# Patient Record
Sex: Female | Born: 1940 | Race: White | Hispanic: No | Smoking: Never smoker
Health system: Southern US, Community
[De-identification: ages and names within clinical notes are randomized; demographics above are authoritative.]

## PROBLEM LIST (undated history)

## (undated) DIAGNOSIS — H269 Unspecified cataract: Secondary | ICD-10-CM

## (undated) DIAGNOSIS — C801 Malignant (primary) neoplasm, unspecified: Secondary | ICD-10-CM

## (undated) DIAGNOSIS — I10 Essential (primary) hypertension: Secondary | ICD-10-CM

## (undated) DIAGNOSIS — M179 Osteoarthritis of knee, unspecified: Secondary | ICD-10-CM

## (undated) DIAGNOSIS — E785 Hyperlipidemia, unspecified: Secondary | ICD-10-CM

## (undated) DIAGNOSIS — R609 Edema, unspecified: Secondary | ICD-10-CM

## (undated) DIAGNOSIS — K219 Gastro-esophageal reflux disease without esophagitis: Secondary | ICD-10-CM

## (undated) DIAGNOSIS — M1710 Unilateral primary osteoarthritis, unspecified knee: Secondary | ICD-10-CM

## (undated) HISTORY — DX: Unspecified cataract: H26.9

## (undated) HISTORY — PX: OTHER SURGICAL HISTORY: SHX169

## (undated) HISTORY — PX: HERNIA REPAIR: SHX51

## (undated) HISTORY — DX: Edema, unspecified: R60.9

## (undated) HISTORY — PX: NECK DISSECTION: SUR422

## (undated) HISTORY — DX: Hyperlipidemia, unspecified: E78.5

## (undated) HISTORY — DX: Essential (primary) hypertension: I10

## (undated) HISTORY — DX: Malignant (primary) neoplasm, unspecified: C80.1

## (undated) HISTORY — DX: Gastro-esophageal reflux disease without esophagitis: K21.9

## (undated) HISTORY — DX: Unilateral primary osteoarthritis, unspecified knee: M17.10

## (undated) HISTORY — DX: Osteoarthritis of knee, unspecified: M17.9

---

## 1996-02-24 DIAGNOSIS — C801 Malignant (primary) neoplasm, unspecified: Secondary | ICD-10-CM

## 1996-02-24 HISTORY — DX: Malignant (primary) neoplasm, unspecified: C80.1

## 2003-02-22 ENCOUNTER — Encounter: Payer: Self-pay | Admitting: Family Medicine

## 2006-08-10 ENCOUNTER — Encounter: Payer: Self-pay | Admitting: Family Medicine

## 2006-08-10 LAB — CONVERTED CEMR LAB
ALT: 20 units/L
AST: 23 units/L
Alkaline Phosphatase: 73 units/L
Chloride: 105 meq/L
Total Protein: 6.4 g/dL

## 2007-02-03 ENCOUNTER — Ambulatory Visit: Payer: Self-pay | Admitting: Family Medicine

## 2007-02-03 DIAGNOSIS — R609 Edema, unspecified: Secondary | ICD-10-CM | POA: Insufficient documentation

## 2007-02-03 DIAGNOSIS — M25561 Pain in right knee: Secondary | ICD-10-CM

## 2007-02-03 DIAGNOSIS — M25562 Pain in left knee: Secondary | ICD-10-CM

## 2007-02-03 DIAGNOSIS — I10 Essential (primary) hypertension: Secondary | ICD-10-CM | POA: Insufficient documentation

## 2007-02-08 ENCOUNTER — Encounter: Payer: Self-pay | Admitting: Family Medicine

## 2007-02-09 ENCOUNTER — Encounter: Payer: Self-pay | Admitting: Family Medicine

## 2007-02-09 LAB — CONVERTED CEMR LAB
ALT: 16 units/L (ref 0–35)
Albumin: 4 g/dL (ref 3.5–5.2)
Alkaline Phosphatase: 82 units/L (ref 39–117)
Chloride: 104 meq/L (ref 96–112)
Creatinine, Ser: 0.79 mg/dL (ref 0.40–1.20)
Glucose, Bld: 100 mg/dL — ABNORMAL HIGH (ref 70–99)
Total Protein: 6.8 g/dL (ref 6.0–8.3)
Triglycerides: 78 mg/dL (ref <150)
VLDL: 16 mg/dL (ref 0–40)

## 2007-02-10 ENCOUNTER — Encounter: Payer: Self-pay | Admitting: Family Medicine

## 2007-02-21 ENCOUNTER — Encounter: Payer: Self-pay | Admitting: Family Medicine

## 2007-04-07 ENCOUNTER — Ambulatory Visit: Payer: Self-pay | Admitting: Family Medicine

## 2007-08-04 ENCOUNTER — Other Ambulatory Visit: Admission: RE | Admit: 2007-08-04 | Discharge: 2007-08-04 | Payer: Self-pay | Admitting: Family Medicine

## 2007-08-04 ENCOUNTER — Ambulatory Visit: Payer: Self-pay | Admitting: Family Medicine

## 2007-08-04 ENCOUNTER — Encounter: Payer: Self-pay | Admitting: Family Medicine

## 2007-08-09 LAB — CONVERTED CEMR LAB: Pap Smear: NORMAL

## 2007-08-24 ENCOUNTER — Ambulatory Visit: Payer: Self-pay | Admitting: Family Medicine

## 2007-08-24 LAB — CONVERTED CEMR LAB
OCCULT 1: NEGATIVE
OCCULT 2: NEGATIVE
OCCULT 3: NEGATIVE

## 2008-08-30 ENCOUNTER — Ambulatory Visit: Payer: Self-pay | Admitting: Family Medicine

## 2008-08-30 DIAGNOSIS — Z780 Asymptomatic menopausal state: Secondary | ICD-10-CM | POA: Insufficient documentation

## 2008-09-03 ENCOUNTER — Telehealth (INDEPENDENT_AMBULATORY_CARE_PROVIDER_SITE_OTHER): Payer: Self-pay | Admitting: *Deleted

## 2008-09-10 ENCOUNTER — Encounter: Admission: RE | Admit: 2008-09-10 | Discharge: 2008-09-10 | Payer: Self-pay | Admitting: Family Medicine

## 2008-09-10 ENCOUNTER — Encounter: Payer: Self-pay | Admitting: Family Medicine

## 2008-09-10 DIAGNOSIS — M899 Disorder of bone, unspecified: Secondary | ICD-10-CM

## 2008-09-10 DIAGNOSIS — M949 Disorder of cartilage, unspecified: Secondary | ICD-10-CM

## 2008-09-10 DIAGNOSIS — M8580 Other specified disorders of bone density and structure, unspecified site: Secondary | ICD-10-CM | POA: Insufficient documentation

## 2008-09-11 DIAGNOSIS — R928 Other abnormal and inconclusive findings on diagnostic imaging of breast: Secondary | ICD-10-CM | POA: Insufficient documentation

## 2008-09-11 DIAGNOSIS — E785 Hyperlipidemia, unspecified: Secondary | ICD-10-CM | POA: Insufficient documentation

## 2008-09-11 LAB — CONVERTED CEMR LAB
AST: 20 units/L (ref 0–37)
Albumin: 4.4 g/dL (ref 3.5–5.2)
BUN: 22 mg/dL (ref 6–23)
CO2: 30 meq/L (ref 19–32)
Calcium: 9.5 mg/dL (ref 8.4–10.5)
Chloride: 101 meq/L (ref 96–112)
Cholesterol: 215 mg/dL — ABNORMAL HIGH (ref 0–200)
Creatinine, Ser: 0.91 mg/dL (ref 0.40–1.20)
Glucose, Bld: 102 mg/dL — ABNORMAL HIGH (ref 70–99)
LDL Cholesterol: 139 mg/dL — ABNORMAL HIGH (ref 0–99)
Potassium: 4 meq/L (ref 3.5–5.3)
Sodium: 142 meq/L (ref 135–145)
Total Protein: 7 g/dL (ref 6.0–8.3)
VLDL: 17 mg/dL (ref 0–40)

## 2008-10-01 ENCOUNTER — Encounter: Admission: RE | Admit: 2008-10-01 | Discharge: 2008-10-01 | Payer: Self-pay | Admitting: Family Medicine

## 2008-12-25 ENCOUNTER — Telehealth (INDEPENDENT_AMBULATORY_CARE_PROVIDER_SITE_OTHER): Payer: Self-pay | Admitting: *Deleted

## 2009-08-02 ENCOUNTER — Ambulatory Visit: Payer: Self-pay | Admitting: Family Medicine

## 2009-08-02 DIAGNOSIS — M47816 Spondylosis without myelopathy or radiculopathy, lumbar region: Secondary | ICD-10-CM | POA: Insufficient documentation

## 2009-08-02 DIAGNOSIS — R209 Unspecified disturbances of skin sensation: Secondary | ICD-10-CM

## 2009-08-21 ENCOUNTER — Encounter: Payer: Self-pay | Admitting: Family Medicine

## 2009-08-21 LAB — CONVERTED CEMR LAB
ALT: 15 units/L (ref 0–35)
Albumin: 4.2 g/dL (ref 3.5–5.2)
CO2: 30 meq/L (ref 19–32)
Cholesterol: 195 mg/dL (ref 0–200)
Glucose, Bld: 105 mg/dL — ABNORMAL HIGH (ref 70–99)
LDL Cholesterol: 110 mg/dL — ABNORMAL HIGH (ref 0–99)
Potassium: 3.8 meq/L (ref 3.5–5.3)
Sodium: 140 meq/L (ref 135–145)
TSH: 2.746 microintl units/mL (ref 0.350–4.500)
Total CHOL/HDL Ratio: 3
Triglycerides: 99 mg/dL (ref <150)
Vitamin B-12: 345 pg/mL (ref 211–911)

## 2009-08-27 ENCOUNTER — Telehealth: Payer: Self-pay | Admitting: Family Medicine

## 2009-09-30 ENCOUNTER — Ambulatory Visit: Payer: Self-pay | Admitting: Family Medicine

## 2009-09-30 DIAGNOSIS — L03119 Cellulitis of unspecified part of limb: Secondary | ICD-10-CM

## 2009-09-30 DIAGNOSIS — L02419 Cutaneous abscess of limb, unspecified: Secondary | ICD-10-CM

## 2009-10-01 ENCOUNTER — Encounter: Payer: Self-pay | Admitting: Family Medicine

## 2009-10-01 ENCOUNTER — Telehealth (INDEPENDENT_AMBULATORY_CARE_PROVIDER_SITE_OTHER): Payer: Self-pay | Admitting: *Deleted

## 2009-10-01 LAB — CONVERTED CEMR LAB
Basophils Absolute: 0 10*3/uL (ref 0.0–0.1)
Eosinophils Relative: 3 % (ref 0–5)
HCT: 37.8 % (ref 36.0–46.0)
MCHC: 32.3 g/dL (ref 30.0–36.0)
MCV: 97.4 fL (ref 78.0–100.0)
Monocytes Absolute: 0.7 10*3/uL (ref 0.1–1.0)
Monocytes Relative: 10 % (ref 3–12)
Neutro Abs: 4.8 10*3/uL (ref 1.7–7.7)
WBC: 6.8 10*3/uL (ref 4.0–10.5)

## 2009-10-03 ENCOUNTER — Encounter: Payer: Self-pay | Admitting: Family Medicine

## 2009-10-17 ENCOUNTER — Encounter: Payer: Self-pay | Admitting: Family Medicine

## 2009-10-18 ENCOUNTER — Inpatient Hospital Stay (HOSPITAL_COMMUNITY): Admission: RE | Admit: 2009-10-18 | Discharge: 2009-10-21 | Payer: Self-pay | Admitting: Orthopedic Surgery

## 2010-03-12 ENCOUNTER — Telehealth (INDEPENDENT_AMBULATORY_CARE_PROVIDER_SITE_OTHER): Payer: Self-pay | Admitting: *Deleted

## 2010-03-25 NOTE — Letter (Signed)
Summary: [facility]   Imported By: Lanelle Bal 10/30/2009 09:38:32  _____________________________________________________________________  External Attachment:    Type:   Image     Comment:   External Document

## 2010-03-25 NOTE — Assessment & Plan Note (Signed)
Summary: HTN/ facial numbness/ LBP   Vital Signs:  Patient profile:   70 year old female Height:      63.5 inches Weight:      160 pounds BMI:     28.00 O2 Sat:      96 % on Room air Pulse rate:   88 / minute BP sitting:   126 / 79  (left arm) Cuff size:   large  Vitals Entered By: Payton Spark CMA (August 02, 2009 10:09 AM)  O2 Flow:  Room air CC: F/U HTN. Also c/o back pain.   Primary Care Provider:  Seymour Bars DO  CC:  F/U HTN. Also c/o back pain.Katherine Cohen  History of Present Illness: 70 yo WF presens for f/u HTN and about 1 month of LBP.  She is a Runner, broadcasting/film/video and after prolonged standing, her Lower back hurts.  It is also worse iwth doing housework on the weekends.  Denies hx of back problems or trauma.  She is on Diclofenac for arthritis which helps along with aspercreme and rest.  Denies radiation down the buttock or legs.  No tingling or weakness in her legs.   She has tingling over the L side of her face.  This started about 6 mos ago.  Denies any change in vision, trouble swallowing or with facial movements.  She has not been the the dentist in years.  Denies jaw pain, dental pain or ear pain.  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  She has not followed up with oncology, ENT for years after her surgery.  Denies HAs.    Current Medications (verified): 1)  Prilosec Otc 20 Mg  Tbec (Omeprazole Magnesium) .... Take 1 Tablet By Mouth Once A Day 2)  Diclofenac Sodium 75 Mg  Tbec (Diclofenac Sodium) .... Take 1 Tablet By Mouth Two Times A Day 3)  Enalapril-Hydrochlorothiazide 10-25 Mg Tabs (Enalapril-Hydrochlorothiazide) .Katherine Cohen.. 1 Tab By Mouth Daily 4)  Aspirin 81 Mg  Tbec (Aspirin) .... One By Mouth Every Day 5)  Calcium 1500 Mg Tabs (Calcium Carbonate) .Katherine Cohen.. 1 By Mouth Once Daily 6)  Graduated Compression Panty Hose .... Dx: Bilateral Venous Stasis, Leg Edema 7)  Pravastatin Sodium 20 Mg Tabs (Pravastatin Sodium) .Katherine Cohen.. 1 Tab By  Mouth Qhs  Allergies (verified): No Known Drug Allergies  Past History:  Past Medical History: Reviewed history from 08/30/2008 and no changes required. 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 neck dissection, L  Family History: Reviewed history from 02/03/2007 and no changes required. mother died of stroke at 74. father died of AMI at 97 brother died of lung cancer brother died of MVA 5 sibblings alive  Social History: Reviewed history from 02/03/2007 and no changes required. Barrister's clerk at [employer].   Married to [place].  Has 2 grown sons in [place] and [place]. 1 granddaughter. Never smoked. 2-3 glasses of wine/ day No exercise. Fair diet.  Review of Systems      See HPI  Physical Exam  General:  alert, well-developed, well-nourished, and well-hydrated.   Head:  normocephalic and atraumatic.   Eyes:  PERRLA Nose:  no nasal discharge.   Mouth:    o/p pink and slightly dry.good dentition and poor dentition.   Neck:  L neck w/ surgical scars and one hard nodule in the submandibular region, slightly fixed. Lungs:  Normal  respiratory effort, chest expands symmetrically. Lungs are clear to auscultation, no crackles or wheezes.no crackles.   Heart:  normal rate, regular rhythm, and no murmur.   Msk:  lumbar scoliosis with  Skin:  color normal.   Cervical Nodes:  No lymphadenopathy noted Psych:  good eye contact, not anxious appearing, and not depressed appearing.     Impression & Recommendations:  Problem # 1:  LUMBAGO (ICD-724.2) L Lower MSK back pain with scoliosis and lack of exercise.  Seeing improvement with walking, sleeping, NSAIDs and aspercreme.  Will set her up for PT to eval and treat and show her how to stretch and exercise on her own. Her updated medication list for this problem includes:    Diclofenac Sodium 75 Mg Tbec (Diclofenac sodium) .Katherine Cohen...  Take 1 tablet by mouth two times a day    Aspirin 81 Mg Tbec (Aspirin) ..... One by mouth every day  Orders: Physical Therapy Referral (PT)  Her updated medication list for this problem includes:    Diclofenac Sodium 75 Mg Tbec (Diclofenac sodium) .Katherine Cohen... Take 1 tablet by mouth two times a day    Aspirin 81 Mg Tbec (Aspirin) ..... 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  Normal neuro exam and motor function.  Will get a CT face/ neck to be sure no sign of cancer is present. Orders: T-Vitamin B12 ([id number]) T-CT Maxillofacial w/o cm (09811) T-CT Neck w/o cm (91478)  Problem # 3:  ESSENTIAL HYPERTENSION, BENIGN (ICD-401.1) BP at goal.  Continue current meds.  Update fasting labs.  Congrats on 15# lost.   Her updated medication list for this problem includes:    Enalapril-hydrochlorothiazide 10-25 Mg Tabs (Enalapril-hydrochlorothiazide) .Katherine Cohen... 1 tab by mouth daily  BP today: 126/79 Prior BP: 159/85 (08/30/2008)  Labs Reviewed: K+: 4.0 (09/10/2008) Creat: : 0.91 (09/10/2008)   Chol: 215 (09/10/2008)   HDL: 59 (09/10/2008)   LDL: 139 (09/10/2008)   TG: 84 (09/10/2008)  Complete Medication List: 1)  Prilosec Otc 20 Mg Tbec (Omeprazole magnesium) .... Take 1 tablet by mouth once a day 2)  Diclofenac Sodium 75 Mg Tbec (Diclofenac sodium) .... Take 1 tablet by mouth two times a day 3)  Enalapril-hydrochlorothiazide 10-25 Mg Tabs (Enalapril-hydrochlorothiazide) .Katherine Cohen.. 1 tab by mouth daily 4)  Aspirin 81 Mg Tbec (Aspirin) .... One by mouth every day 5)  Calcium 1500 Mg Tabs (Calcium carbonate) .Katherine Cohen.. 1 by mouth once daily 6)  Graduated Compression Panty Hose  .... Dx: bilateral venous stasis, leg edema 7)  Pravastatin Sodium 20 Mg Tabs (Pravastatin sodium) .Katherine Cohen.. 1 tab by mouth qhs  Other Orders: T-Comprehensive Metabolic  Panel ([id number]) T-Lipid Profile [id number]) T-TSH ([phone]) T-Vitamin D (25-Hydroxy) ([phone])  Patient Instructions: 1)  BP looks great! 2)  Stay on current meds. 3)  Update fasting labs downstairs. 4)  Will call you w/ the results. 5)  Will plan to do your CT face/ neck in the next 2 wks. 6)  Return for f/u in 4 mos.

## 2010-03-25 NOTE — Progress Notes (Signed)
Summary: refill enalapril  Phone Note Refill Request Message from:  Fax from Pharmacy on August 27, 2009 3:01 PM  Refills Requested: Medication #1:  ENALAPRIL MALEATE 20 MG TABS 1 tab by mouth daily [facility] [phone]   Method Requested: Fax to Local Pharmacy Initial call taken by: Duard Brady LPN,  August 27, 1912 3:01 PM    Prescriptions: ENALAPRIL MALEATE 20 MG TABS (ENALAPRIL MALEATE) 1 tab by mouth daily  #30 x 3   Entered by:   Duard Brady LPN   Authorized by:   Seymour Bars DO   Signed by:   Duard Brady LPN on 78/29/5621   Method used:   [facility]ed to ...       [facility]       [address]       [place], [place]  [address]       Ph: [phone]       Fax: [phone]   RxID:   [id number]

## 2010-03-25 NOTE — Progress Notes (Signed)
Summary: DVT study : NEG  Phone Note Outgoing Call   Summary of Call: Pls let pt know that her RLE venous doppler is negative for DVT.   Initial call taken by: Seymour Bars DO,  October 01, 2009 4:21 PM  Follow-up for Phone Call        Pt aware of the above Follow-up by: Payton Spark CMA,  October 01, 2009 4:26 PM

## 2010-03-25 NOTE — Assessment & Plan Note (Signed)
Summary: traumatic purpura   Vital Signs:  Patient profile:   70 year old female Height:      63.5 inches Weight:      164 pounds BMI:     28.70 O2 Sat:      97 % on Room air Temp:     98.1 degrees F oral Pulse rate:   67 / minute BP sitting:   149 / 77  (left arm) Cuff size:   regular  Vitals Entered By: Payton Spark CMA (September 30, 2009 1:44 PM)  O2 Flow:  Room air CC: R lower leg pain and swelling x 1.5 weeks. Wooden crate filled w/ corn fell on lower R leg.   Primary Care Provider:  Seymour Bars DO  CC:  R lower leg pain and swelling x 1.5 weeks. Wooden crate filled w/ corn fell on lower R leg.Marland Kitchen  History of Present Illness: 70 yo WF presents for RLE pain and swelling x 10 days ago.  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  She reports that it was rather heavy and she has had pain ever since.  She is only taking OTC pain meds.  She is able to bear weight and ambulate with minimal pain.  She has had redness and blistering with severe swelling over with distal RLE.  She has been elevating it and taking old Lasix.  Denies fevers, chills or bleeding.  Current Medications (verified): 1)  Prilosec Otc 20 Mg  Tbec (Omeprazole Magnesium) .... Take 1 Tablet By Mouth Once A Day 2)  Diclofenac Sodium 75 Mg  Tbec (Diclofenac Sodium) .... Take 1 Tablet By Mouth Two Times A Day 3)  Enalapril Maleate 20 Mg Tabs (Enalapril Maleate) .Marland Kitchen.. 1 Tab By Mouth Daily 4)  Aspirin 81 Mg  Tbec (Aspirin) .... One By Mouth Every Day 5)  Calcium 1500 Mg Tabs (Calcium Carbonate) .Marland Kitchen.. 1 By Mouth Once Daily 6)  Pravastatin Sodium 20 Mg Tabs (Pravastatin Sodium) .Marland Kitchen.. 1 Tab By Mouth Qhs  Allergies (verified): No Known Drug Allergies  Past History:  Past Medical History: Reviewed history from 08/30/2008 and no changes required. 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 Museum/gallery conservator at [employer]   Married to [place].  Has 2 grown sons in [place] and [place]. 1 granddaughter. Never smoked. 2-3 glasses of wine/ day No exercise. Fair diet.  Review of Systems      See HPI  Physical Exam  General:  alert, well-developed, well-nourished, and well-hydrated.   Head:  normocephalic and atraumatic.   Lungs:  Normal respiratory effort, chest expands symmetrically. Lungs are clear to auscultation, no crackles or wheezes.no crackles.   Heart:  normal rate, regular rhythm, and no murmur.   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   Skin:  RLE purpura with moderate sized bullae, tender with surrounding erythema Psych:  good eye contact, not anxious appearing, and not depressed appearing.     Impression & Recommendations:  Problem # 1:  CELLULITIS AND ABSCESS OF LEG EXCEPT FOOT (ICD-682.6) With bullae and purpura, secondary to trauma. Treat with Bactrim DS x 10 days and Furosemide 40 mg/ day for swelling x 10  days. Elevate legs and carefully clean with soap and water.  Will send to Dr Lajoyce Corners for wound care -- may need [facility]. Labs today - CBC and D Dimer given unilateral acute swelling.  Her updated medication list for this problem includes:    Sulfamethoxazole-tmp Ds 800-160 Mg Tabs (Sulfamethoxazole-trimethoprim) .Marland Kitchen... 1 tab by mouth two times a day x 10 days  Orders: Wound Care Center Referral (Wound Care) T-CBC w/Diff [phone])  Complete Medication List: 1)  Prilosec Otc 20 Mg Tbec (Omeprazole magnesium) .... Take 1 tablet by mouth once a day 2)  Diclofenac Sodium 75 Mg Tbec (Diclofenac sodium) .... Take 1 tablet by mouth two times a day 3)  Enalapril Maleate 20 Mg Tabs (Enalapril maleate) .Marland Kitchen.. 1 tab by mouth daily 4)  Aspirin 81 Mg Tbec (Aspirin) .... One by mouth every day 5)   Calcium 1500 Mg Tabs (Calcium carbonate) .Marland Kitchen.. 1 by mouth once daily 6)  Pravastatin Sodium 20 Mg Tabs (Pravastatin sodium) .Marland Kitchen.. 1 tab by mouth qhs 7)  Furosemide 40 Mg Tabs (Furosemide) .Marland Kitchen.. 1 tab by mouth daily x 10 days 8)  Sulfamethoxazole-tmp Ds 800-160 Mg Tabs (Sulfamethoxazole-trimethoprim) .Marland Kitchen.. 1 tab by mouth two times a day x 10 days  Other Orders: T-D-Dimer Fibrin Derivatives Quantitive [id number])  Patient Instructions: 1)  Lab today. 2)  Will call you w/ results tomorrow. 3)  Start on generic Bactrim DS today (antibiotic) and take for 10 days (with food). 4)  Take Furosemide 40 mg daily as your water pill. 5)  Eat a banana each day to supplement your potassium while on this. 6)  Gently clean R leg with soap and water each day. 7)  Elevate leg and stay off your feet until you are seen by wound care downstairs.   Prescriptions: SULFAMETHOXAZOLE-TMP DS 800-160 MG TABS (SULFAMETHOXAZOLE-TRIMETHOPRIM) 1 tab by mouth two times a day x 10 days  #20 x 0   Entered and Authorized by:   Seymour Bars DO   Signed by:   Seymour Bars DO on 09/30/2009   Method used:   [facility] to        [facility] [phone]* (retail)       [address] [place], [place]  [address]       Ph: [phone] or [phone]       Fax: [phone]   RxID:   [id number] FUROSEMIDE 40 MG TABS (FUROSEMIDE) 1 tab by mouth daily x 10 days  #10 x 0   Entered and Authorized by:   Seymour Bars DO   Signed by:   Seymour Bars DO on 09/30/2009   Method used:   [facility] to        [facility] [phone]* (retail)       [address] [place], [place]  [address]       Ph: [phone] or [phone]       Fax: [phone]   RxID:   [id number]

## 2010-03-25 NOTE — Consult Note (Signed)
Summary: [facility]   Imported By: Lanelle Bal 10/18/2009 13:05:52  _____________________________________________________________________  External Attachment:    Type:   Image     Comment:   External Document

## 2010-03-27 NOTE — Progress Notes (Signed)
Summary: Need an appt?  Phone Note Refill Request Call back at Work Phone [phone] Message from:  Patient on March 12, 2010 1:23 PM  Refills Requested: Medication #1:  PRAVASTATIN SODIUM 20 MG TABS 1 tab by mouth qhs   Dosage confirmed as above?Dosage Confirmed   Brand Name Necessary? No   Supply Requested: 3 months  Medication #2:  DICLOFENAC SODIUM 75 MG  TBEC Take 1 tablet by mouth two times a day   Dosage confirmed as above?Dosage Confirmed   Brand Name Necessary? No   Supply Requested: 3 months  Medication #3:  ENALAPRIL MALEATE 20 MG TABS 1 tab by mouth daily   Dosage confirmed as above?Dosage Confirmed   Brand Name Necessary? 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       [facility]       [address]       [place], [place]  [address]       Ph: [phone]       Fax: [phone]   RxID:   [id number] DICLOFENAC SODIUM 75 MG  TBEC (DICLOFENAC SODIUM) Take 1 tablet by mouth two times a day  #180 x 0   Entered by:   Payton Spark CMA   Authorized by:   Seymour Bars DO   Signed by:   Payton Spark CMA on 03/12/2010   Method used:   Faxed to ...       [facility]       [address]       [place], [place]  [address]       Ph: [phone]       Fax: [phone]   RxID:   [id number] ENALAPRIL MALEATE 20 MG TABS  (ENALAPRIL MALEATE) 1 tab by mouth daily  #90 x 0   Entered by:   Payton Spark CMA   Authorized by:   Seymour Bars DO   Signed by:   Payton Spark CMA on 03/12/2010   Method used:   Faxed to ...       [facility]       [address]       [place], [place]  [address]       Ph: [phone]       Fax: [phone]   RxID:   [id number]

## 2010-05-09 LAB — COMPREHENSIVE METABOLIC PANEL
Albumin: 4.3 g/dL (ref 3.5–5.2)
BUN: 18 mg/dL (ref 6–23)
CO2: 31 mEq/L (ref 19–32)
Calcium: 9.7 mg/dL (ref 8.4–10.5)
Chloride: 102 mEq/L (ref 96–112)
Creatinine, Ser: 0.79 mg/dL (ref 0.4–1.2)
GFR calc Af Amer: >60 mL/min (ref >60)
GFR calc non Af Amer: >60 mL/min (ref >60)
Sodium: 139 mEq/L (ref 135–145)
Total Bilirubin: 0.6 mg/dL (ref 0.3–1.2)
Total Protein: 7 g/dL (ref 6.0–8.3)

## 2010-05-09 LAB — PROTIME-INR
INR: 0.9 (ref 0.00–1.49)
Prothrombin Time: 12.4 seconds (ref 11.6–15.2)

## 2010-05-09 LAB — SURGICAL PCR SCREEN: MRSA, PCR: NEGATIVE

## 2010-05-09 LAB — CBC: RDW: 13.3 % (ref 11.5–15.5)

## 2010-07-05 ENCOUNTER — Encounter: Payer: Self-pay | Admitting: Family Medicine

## 2010-07-07 ENCOUNTER — Ambulatory Visit (INDEPENDENT_AMBULATORY_CARE_PROVIDER_SITE_OTHER): Payer: Self-pay | Admitting: Family Medicine

## 2010-07-07 ENCOUNTER — Encounter: Payer: Self-pay | Admitting: Family Medicine

## 2010-07-07 VITALS — BP 157/87 | HR 71 | Ht 63.5 in | Wt 161.0 lb

## 2010-07-07 DIAGNOSIS — Z0000 Encounter for general adult medical examination without abnormal findings: Secondary | ICD-10-CM

## 2010-07-07 DIAGNOSIS — K5900 Constipation, unspecified: Secondary | ICD-10-CM

## 2010-07-07 DIAGNOSIS — I10 Essential (primary) hypertension: Secondary | ICD-10-CM

## 2010-07-07 DIAGNOSIS — Z1239 Encounter for other screening for malignant neoplasm of breast: Secondary | ICD-10-CM

## 2010-07-07 DIAGNOSIS — M8580 Other specified disorders of bone density and structure, unspecified site: Secondary | ICD-10-CM

## 2010-07-07 MED ORDER — DICLOFENAC SODIUM 75 MG PO TBEC: 75.0000 mg | DELAYED_RELEASE_TABLET | Freq: Two times a day (BID) | ORAL | Status: DC

## 2010-07-07 MED ORDER — CHLORTHALIDONE 25 MG PO TABS: 25.0000 mg | ORAL_TABLET | Freq: Every day | ORAL | Status: DC

## 2010-07-07 MED ORDER — ENALAPRIL MALEATE 20 MG PO TABS: 20.0000 mg | ORAL_TABLET | Freq: Every day | ORAL | Status: DC

## 2010-07-07 MED ORDER — PRAVASTATIN SODIUM 20 MG PO TABS: 20.0000 mg | ORAL_TABLET | Freq: Every day | ORAL | Status: DC

## 2010-07-07 NOTE — Progress Notes (Signed)
  Subjective:    Patient ID: Katherine Cohen, female    DOB: September 29, 1940, 70 y.o.   MRN: [id number]  HPI 70 yo WF presents for CPE w/o pap smear.  She has HTN, Hyperlpidemia, osteopenia and had a traumatic ulcer on her  Leg that required surgical debridement with Dr Lajoyce Corners last year.  It is healing nicely.  She is due for RF of her meds.  Had labs in June of 2011.  Denies CP or SOB.  She is overdue for mammogram after having an abnormal in 2010 with failure to return.  Her DEXA is due.  She is married.  Diclofenac does help her arthritis pain.  BP 157/87  Pulse 71  Ht 5' 3.5" (1.613 m)  Wt 161 lb (73.029 kg)  BMI 28.07 kg/m2  SpO2 98%   Review of Systems Gen: no fevers, chills, hot flashes, night sweats, change in weight GI: no N/V/ D.  New onset constipation, no blood in stools, RLQ pain. GU: no dysuria, incontinence or sexual dysfunction CV: no chest pain, DOE, palpitations s or edema Pulm:  Denies CP, SOB or chronic cough      Objective:   Physical Exam    Gen: alert, well groomed in NAD, overwt Neck: no thyromegaly or cervical lymphadenopathy, s/p surgical neck dissection with scarring on the L. CV: RRR w/o murmur, no audible carotid bruits or abdominal aortic bruits Ext: 2+ nonpitting edema with RLE distal scar from previous ulcer with hyperemia, no clubbing or cyanosis Lungs: CTA bilat w/o W/R/R; nonlabored HEENT:  [place]/AT; PERRLA; oropharynx pink and moist with good dentition Abd: soft, NT, ND, NABS, No HSM, no audible AA bruits Skin: warm and dry; no rash, pallor or jaundice Psych: does not appear anxious or depressed; answers questions appropriately Breasts: no palpable masses, dimpling, axillary LA, nipple discharge    Assessment & Plan:  Assesment:  1. CPE- Keeping healthy checklist for women reviewed today.  BP high.  Will add Chlorthalidone 25 mg/day to enalapril.  Recheck in 3 mos with fasting labs.  BMI 28 in the overwt range.     Labs due at next visit Colonoscopy  overdue.  She agrees to see GI given new onset constipation and RLQ pain.  No sign of acute abdomen today.  Will have her add Miralax once daily to her probiotic and increase water/ fiber in diet. Mammogram and DEXA overdue, will schedule. Encouraged healthy diet, regular exercise, MVI daily. EKG done today shows sinus brady at 57 bpm, QTc 414 ms, PR 132 ms, normal axis, no ischemia. RFd meds.

## 2010-07-07 NOTE — Patient Instructions (Signed)
For constipation:  Add Miralax OTC once daily. Stay on probiotics.  Drink plenty of water and a high fiber diet (lots of fruits and veggies)  For BP, stay on enalapril but add Chlorthalidone once daily.  Referral made to [facility] re: colonoscopy.   EKG today.  DEXA/ mammogram order printed.  Return for f/u BP/ fasting labs in August

## 2010-07-23 ENCOUNTER — Encounter: Payer: Self-pay | Admitting: Family Medicine

## 2010-09-22 ENCOUNTER — Telehealth: Payer: Self-pay | Admitting: Family Medicine

## 2010-09-22 NOTE — Telephone Encounter (Signed)
Pt called and is inquiring if records have been sent to [place] in [place]?  She had received letter in mail stating Dr. Cathey Endow leaving [facility], and she wanted to know if her records had been sent to her attorney yet that had requested them?  Please advise.  They were suppose to go to [employer] in [place] # [phone] and fax # [phone]. Plan:  Checked the records that had been received up front since last Thursday, and this patient does not have a request for records thus far.   Jarvis Newcomer, LPN [facility]

## 2010-09-22 NOTE — Telephone Encounter (Signed)
Pt informed that to date as far as Dr. Cathey Endow is aware we have not received any letter for records, etc.  Told the pt that she would need to sign medical release of records and tell the attorney to send the request in writing.  Pt voiced understanding. Jarvis Newcomer, LPN [facility]

## 2010-09-22 NOTE — Telephone Encounter (Signed)
I don't think her attourney sent [place] anything.  Ask them to send [place] a letter including a request for her medical records and she must have a signed release before we can send anything.

## 2010-11-13 ENCOUNTER — Encounter: Payer: Self-pay | Admitting: Family Medicine

## 2010-11-13 ENCOUNTER — Ambulatory Visit (INDEPENDENT_AMBULATORY_CARE_PROVIDER_SITE_OTHER): Payer: 59 | Admitting: Family Medicine

## 2010-11-13 VITALS — BP 140/86 | HR 70 | Wt 162.0 lb

## 2010-11-13 DIAGNOSIS — K5900 Constipation, unspecified: Secondary | ICD-10-CM

## 2010-11-13 MED ORDER — MAGNESIUM CITRATE PO SOLN: 300.0000 mL | Freq: Once | ORAL | Status: AC

## 2010-11-13 NOTE — Patient Instructions (Addendum)
Drink more water daily Try benefiber mixed with your drink for each meal.  The magnesium citrate is to get her bowels going.  Hold chorthalidone for now.

## 2010-11-13 NOTE — Progress Notes (Signed)
  Subjective:    Patient ID: Katherine Cohen, female    DOB: Dec 02, 1940, 70 y.o.   MRN: [id number]  HPI  Constipation - stools are every other day. They are hard. Does have to strain.  Eating avocados and that has helped some.  Tried a fiber supplment. Tired miralax and no relief.  No blood in her stool.  Has tried to eat more fruits and vegetables. Started chlorthalidone about 6 months ago.  She has been more constipated for about the last 5 months.    Review of Systems  BP 140/86  Pulse 70  Wt 162 lb (73.483 kg)    No Known Allergies  Past Medical History  Diagnosis Date  . OA (osteoarthritis) of knee   . Hypertension   . 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 GERD (gastroesophageal reflux disease)   . Edema     leg    Past Surgical History  Procedure Date  . Neck dissection     L    History   Social History  . Marital Status: Married    Spouse Name: N/A    Number of Children: N/A  . Years of Education: N/A   Occupational History  . Not on file.   Social History Main Topics  . Smoking status: Never Smoker   . Smokeless tobacco: Not on file  . Alcohol Use: Yes  . Drug Use: Yes    Special: Hydromorphone  . Sexually Active:    Other Topics Concern  . Not on file   Social History Narrative  . No narrative on file    Family History  Problem Relation Age of Onset  . Stroke Mother   . Heart disease Father     AMI  . Cancer Brother     lung    Katherine Cohen does not currently have medications on file.     Objective:   Physical Exam  Constitutional: She is oriented to person, place, and time. She appears well-developed and well-nourished.  Cardiovascular: Normal rate, regular rhythm and normal heart sounds.   Pulmonary/Chest: Effort normal and breath sounds normal.  Abdominal: Soft. Bowel sounds are normal. She exhibits no distension and no mass. There is tenderness. There is no rebound and no guarding.       Mldly tender in the RLQ and LLQ.     Neurological: She is alert and oriented to person, place, and time.  Skin: Skin is warm.  Psychiatric: She has a normal mood and affect. Her behavior is normal.          Assessment & Plan:  Constipation - new dx. Dsicuseed using mag citrate (since she tried mirlax for 2 weeks and no relief which is very suprising) daily until BM. Also start benefiber 3 x a day for maintenance. Continue to drink plenty of water and eats fruits and veggies.  Call if not imrpvong. Also she never went for her colonoscopy for screenign this year. She says can't afford to go this year but will go next year. I f her sxs aren't resolving then I strongly recommend she go sooner.

## 2010-11-28 ENCOUNTER — Ambulatory Visit (INDEPENDENT_AMBULATORY_CARE_PROVIDER_SITE_OTHER): Payer: 59 | Admitting: Family Medicine

## 2010-11-28 ENCOUNTER — Encounter: Payer: Self-pay | Admitting: Family Medicine

## 2010-11-28 DIAGNOSIS — M79606 Pain in leg, unspecified: Secondary | ICD-10-CM

## 2010-11-28 DIAGNOSIS — M7989 Other specified soft tissue disorders: Secondary | ICD-10-CM

## 2010-11-28 DIAGNOSIS — M79609 Pain in unspecified limb: Secondary | ICD-10-CM

## 2010-11-28 LAB — CBC WITH DIFFERENTIAL/PLATELET
Hemoglobin: 12.8 g/dL (ref 12.0–15.0)
Lymphs Abs: 1 10*3/uL (ref 0.7–4.0)
MCH: 32.2 pg (ref 26.0–34.0)
MCHC: 33 g/dL (ref 30.0–36.0)
MCV: 97.7 fL (ref 78.0–100.0)
Monocytes Relative: 4 % (ref 3–12)
Platelets: 242 10*3/uL (ref 150–400)
RBC: 3.97 MIL/uL (ref 3.87–5.11)

## 2010-11-28 NOTE — Progress Notes (Signed)
  Subjective:    Patient ID: Katherine Cohen, female    DOB: Aug 19, 1940, 70 y.o.   MRN: [id number]  HPI Woke up Wednesday morning with bruising on the outer RT foot and lower leg.  Feels it is more swollen.  She stopped the diuretic, chlorthalidone. Not painful to walk on. That is tender to touch. She wonders if a blood vessel has burst. She feels both ankles are more swollen than usual. No chest pain or shortness of breath. She does have a previous history of cellulitis. She also has a previous history of surgery on that right ankle. She has a history of venous stasis and chronic edema bilaterally.  Constipation-She did stop her chlorthalidone and says her bowels have been moving much better.     Review of Systems     Objective:   Physical Exam  Musculoskeletal:       She has 1+ pitting edema bilaterally in the lower extremities from about the mid tibia down. On her right outer leg from about mid tibia to below the right lateral malleolus there appears to be a large bruise. In the center of that just above the lateral malleolus is an erythematous tender papule. She also has an erythematous tender papule posteriorly on the calf on her right leg.          Assessment & Plan:  Right leg swelling - based on her exam I am suspicious of superficial thrombophlebitis. The thalami could get a d-dimer to make sure she is low-risk for a DVT. She can restart her chlorthalidone for the next couple days to get a little extra fluid off. The heart continue wear her compression stockings which she has with her today. I want her to increase her 81 mg aspirin to 325 once daily with food and water. She has no prior history of GI ulcers or GERD and is low risk for GI bleeding except for her age.  HTN - blood pressure is very elevated today. This may be from recently stopping her chlorthalidone. I would like her to take it for the next 2 days to get some extra fluid off and really watch her salt intake. She can  start using the chlorthalidone as needed. She is guarding max on her ACE inhibitor so we may need to add a second agent if her blood pressure doesn't go back down. She says she's not concerned about it because usually Friday afternoons her extreme stressful for her and she feels like the high blood pressure is from work and then trying to rush here.

## 2010-11-28 NOTE — Patient Instructions (Addendum)
Can take your chlorthalidone for a couple of days and get the extra fluid off.  Take regular aspirin once a day with food and water.

## 2010-12-01 ENCOUNTER — Telehealth: Payer: Self-pay | Admitting: *Deleted

## 2010-12-01 NOTE — Telephone Encounter (Signed)
Message copied by Wyline Beady on Mon Dec 01, 2010 11:33 AM ------      Message from: Nani Gasser D      Created: Fri Nov 28, 2010  9:10 PM       Called pt and gave her the result. Needs to go to ED for [place] eval to check for blood clots. I told her can't wait until Monday as there is a risk for PE.  She says she want to wait until AM. I told her if any CP or SOB go to ED Immediately. She plans to go in AM. Please call and check on pt on Monday.

## 2010-12-01 NOTE — Telephone Encounter (Signed)
Called and no answer at the home number

## 2010-12-02 ENCOUNTER — Telehealth: Payer: Self-pay | Admitting: *Deleted

## 2010-12-02 NOTE — Telephone Encounter (Signed)
I have called and no answer however pt apparently was seen at [facility]. Pt was notified to make a hospital appt

## 2010-12-02 NOTE — Telephone Encounter (Signed)
Message copied by Wyline Beady on Tue Dec 02, 2010  4:57 PM ------      Message from: Nani Gasser D      Created: Fri Nov 28, 2010  9:10 PM       Called pt and gave her the result. Needs to go to ED for [place] eval to check for blood clots. I told her can't wait until Monday as there is a risk for PE.  She says she want to wait until AM. I told her if any CP or SOB go to ED Immediately. She plans to go in AM. Please call and check on pt on Monday.

## 2011-03-27 ENCOUNTER — Encounter: Payer: Self-pay | Admitting: Family Medicine

## 2011-03-27 ENCOUNTER — Ambulatory Visit (INDEPENDENT_AMBULATORY_CARE_PROVIDER_SITE_OTHER): Payer: 59 | Admitting: Family Medicine

## 2011-03-27 VITALS — BP 170/86 | HR 94 | Wt 168.0 lb

## 2011-03-27 DIAGNOSIS — I10 Essential (primary) hypertension: Secondary | ICD-10-CM

## 2011-03-27 DIAGNOSIS — R1903 Right lower quadrant abdominal swelling, mass and lump: Secondary | ICD-10-CM

## 2011-03-27 DIAGNOSIS — R198 Other specified symptoms and signs involving the digestive system and abdomen: Secondary | ICD-10-CM

## 2011-03-27 MED ORDER — METOPROLOL SUCCINATE ER 50 MG PO TB24: 50.0000 mg | ORAL_TABLET | Freq: Every day | ORAL | Status: DC

## 2011-03-27 NOTE — Patient Instructions (Signed)
We will call you with your lab results. If you don't here from [place] in about a week then please give [place] a call at [phone]. We will call you with the CT appointment.

## 2011-03-27 NOTE — Progress Notes (Signed)
  Subjective:    Patient ID: Katherine Cohen, female    DOB: 1940/10/16, 71 y.o.   MRN: [id number]  HPI She complains of an obstruction in her bowels. She says that she actually has small bowel movements daily and sometimes very loose but she feels like something is obstructing her bowels overall and that it cannot pass. She complained about similar symptoms about 5 months ago. At that time I had recommended her for a gastroenterology but she felt she could not afford it and wanted to wait until this year. She has tried MiraLax for several weeks in the past that has not helped. The last time I saw her I had suggested magnesium citrate and she tried this and felt it did not help. She has also tried enemas and suppositories and these have not helped either. She denies any significant nausea. She says she occasionally has some generalized discomfort. Some of the medications she has tried to get her bowels moving has caused some cramping. She denies any fevers or blood in the stool.  Hypertension-she says she's been taking her medications but admits that she really does not watch when she eats and probably consumes way too much salt. She did take her medication. No increase in swelling peripherally.  Review of Systems     Objective:   Physical Exam  Constitutional: She is oriented to person, place, and time. She appears well-developed and well-nourished.  Cardiovascular: Normal rate, regular rhythm and normal heart sounds.   Pulmonary/Chest: Effort normal and breath sounds normal.  Abdominal: Soft. Bowel sounds are normal. She exhibits mass. She exhibits no distension. There is tenderness. There is no rebound and no guarding.       She has a large visible and palpable mass in the right lower quadrant. I am able to compress it somewhat and can feel bowel gas moving underneath my fingertips. It is tender to touch. It feels sausage-shaped and is approximately 20 cm in length.  Neurological: She is alert and  oriented to person, place, and time.  Skin: Skin is warm and dry.  Psychiatric: She has a normal mood and affect. Her behavior is normal.          Assessment & Plan:  Right lower quad abdominal mass-elbow and refer her to gastroenterology for further evaluation and possible colonoscopy. In the meantime I am scheduling her for CT/abdomen and pelvis with contrast to better evaluate the lesion to make sure that it really is part of her bowel or that maybe something else obstructing such as a pelvic cyst, or something off her bladder or uteruss.  I also ordered lab work today. We'll make sure her kidney function is normal before she has the contrast for CT.  Hypertension-not well controlled today-because her pulse is 95 we'll plan on a beta blocker for better control. Followup in 3-4 weeks.

## 2011-03-28 LAB — COMPLETE METABOLIC PANEL WITH GFR
ALT: 15 U/L (ref 0–35)
AST: 22 U/L (ref 0–37)
Albumin: 4.5 g/dL (ref 3.5–5.2)
Calcium: 9.6 mg/dL (ref 8.4–10.5)
Chloride: 102 mEq/L (ref 96–112)
Potassium: 4.3 mEq/L (ref 3.5–5.3)
Total Protein: 7.2 g/dL (ref 6.0–8.3)

## 2011-03-28 LAB — CBC
Platelets: 266 10*3/uL (ref 150–400)
RDW: 14.5 % (ref 11.5–15.5)
WBC: 5.7 10*3/uL (ref 4.0–10.5)

## 2011-03-29 NOTE — Progress Notes (Signed)
Quick Note:  All labs are normal. ______ 

## 2011-03-30 ENCOUNTER — Other Ambulatory Visit: Payer: Self-pay | Admitting: *Deleted

## 2011-03-30 MED ORDER — METOPROLOL SUCCINATE ER 50 MG PO TB24: 50.0000 mg | ORAL_TABLET | Freq: Every day | ORAL | Status: DC

## 2011-04-03 ENCOUNTER — Other Ambulatory Visit: Payer: 59

## 2011-04-06 ENCOUNTER — Ambulatory Visit
Admission: RE | Admit: 2011-04-06 | Discharge: 2011-04-06 | Disposition: A | Discharge status: Home / Self Care | Payer: 59 | Source: Ambulatory Visit | Attending: Family Medicine | Admitting: Family Medicine

## 2011-04-06 DIAGNOSIS — R1903 Right lower quadrant abdominal swelling, mass and lump: Secondary | ICD-10-CM

## 2011-04-06 DIAGNOSIS — R198 Other specified symptoms and signs involving the digestive system and abdomen: Secondary | ICD-10-CM

## 2011-04-06 MED ORDER — IOHEXOL 300 MG/ML  SOLN
100.0000 mL | Freq: Once | INTRAMUSCULAR | Status: AC | PRN
  Administered 2011-04-06: 100 mL via INTRAVENOUS

## 2011-04-07 ENCOUNTER — Other Ambulatory Visit: Payer: Self-pay | Admitting: Family Medicine

## 2011-04-07 DIAGNOSIS — K439 Ventral hernia without obstruction or gangrene: Secondary | ICD-10-CM

## 2011-04-23 ENCOUNTER — Ambulatory Visit (INDEPENDENT_AMBULATORY_CARE_PROVIDER_SITE_OTHER): Payer: 59 | Admitting: Surgery

## 2011-04-27 ENCOUNTER — Ambulatory Visit: Payer: 59 | Admitting: Family Medicine

## 2011-05-05 ENCOUNTER — Encounter: Payer: Self-pay | Admitting: *Deleted

## 2011-05-06 ENCOUNTER — Encounter (INDEPENDENT_AMBULATORY_CARE_PROVIDER_SITE_OTHER): Payer: Self-pay | Admitting: Surgery

## 2011-05-06 ENCOUNTER — Ambulatory Visit (INDEPENDENT_AMBULATORY_CARE_PROVIDER_SITE_OTHER): Payer: 59 | Admitting: Surgery

## 2011-05-06 VITALS — BP 129/78 | HR 78 | Temp 97.2°F | Resp 16 | Ht 66.0 in | Wt 165.8 lb

## 2011-05-06 DIAGNOSIS — K439 Ventral hernia without obstruction or gangrene: Secondary | ICD-10-CM

## 2011-05-06 DIAGNOSIS — K8010 Calculus of gallbladder with chronic cholecystitis without obstruction: Secondary | ICD-10-CM

## 2011-05-06 NOTE — Progress Notes (Signed)
Patient ID: Katherine Cohen, female   DOB: [birth year], 71 y.o.   MRN: [id number]  Chief Complaint  Patient presents with  . Hernia    HPI Katherine Cohen is a 71 y.o. female.  Referred by Dr. Nani Gasser for evaluation of right spigelian hernia HPI This is a fairly active 71 yo female who presents with a 1 year history of swelling in her RLQ and problems with her bowel movements.  Often, she feels that she cannot fully empty her bowels.  She does report some post-prandial bloating and upper abdominal pain.  She has avoided fatty and spicy foods because of these symptoms.  She denies any diarrhea.  Dr. Linford Arnold evaluated the patient and palpated a large mass in the RLQ, consistent with a hernia containing bowel.  She obtained a CT scan of the patient which showed a RLQ ventral hernia containing non-dilated loops of SB and the cecum.  The right ovary may also be contained in this hernia.  She also has a single 16 mm gallstone.  Past Medical History  Diagnosis Date  . OA (osteoarthritis) of knee   . Hypertension   . 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 GERD (gastroesophageal reflux disease)   . Edema     leg  . Hyperlipidemia     Past Surgical History  Procedure Date  . Neck dissection     L    Family History  Problem Relation Age of Onset  . Stroke Mother   . Heart disease Father     AMI  . Cancer Brother     lung    Social History History  Substance Use Topics  . Smoking status: Never Smoker   . Smokeless tobacco: Not on file  . 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 aspirin 81 MG tablet Take 81 mg by mouth daily.        . diclofenac (VOLTAREN) 75 MG EC tablet Take 1 tablet (75 mg total) by mouth 2 (two) times daily.  180 tablet  3  . enalapril (VASOTEC) 20 MG tablet Take 1 tablet (20 mg total) by mouth daily.  90 tablet  3  . omeprazole  (PRILOSEC OTC) 20 MG tablet Take 20 mg by mouth daily.        . pravastatin (PRAVACHOL) 20 MG tablet Take 1 tablet (20 mg total) by mouth at bedtime.  90 tablet  3    Review of Systems Review of Systems  Constitutional: Negative for fever, chills and unexpected weight change.  HENT: Negative for hearing loss, congestion, sore throat, trouble swallowing and voice change.   Eyes: Negative for visual disturbance.  Respiratory: Negative for cough and wheezing.   Cardiovascular: Negative for chest pain, palpitations and leg swelling.  Gastrointestinal: Positive for nausea, abdominal pain, constipation and abdominal distention. Negative for vomiting, diarrhea, blood in stool and anal bleeding.  Genitourinary: Negative for hematuria, vaginal bleeding and difficulty urinating.  Musculoskeletal: Negative for arthralgias.  Skin: Negative for rash and wound.  Neurological: Negative for seizures, syncope and headaches.  Hematological: Negative for adenopathy. Does not bruise/bleed easily.  Psychiatric/Behavioral: Negative for confusion.    Blood pressure 129/78, pulse 78, temperature 97.2 F (36.2 C), temperature source Temporal, resp. rate 16, height 5\' 6"  (1.676 m), weight 165 lb 12.8 oz (75.206 kg).  Physical Exam Physical Exam WDWN in NAD  HEENT:  EOMI, sclera anicteric Neck:  No masses, no thyromegaly Lungs:  CTA bilaterally; normal respiratory effort CV:  Regular rate and rhythm; no murmurs Abd:  +bowel sounds, soft, non-tender in RUQ; visible,palpable large mass in RLQ - enlarges with Valsalva, reduces when supine Ext:  Well-perfused; no edema Skin:  Warm, dry; no sign of jaundice  Data Reviewed CT Scan  Assessment    1.  RLQ ventral hernia - containing bowel; on CT scan, it looks like a relatively small hernia defect 2.  Chronic calculus cholecystitis    Plan    Recommend laparoscopic cholecystectomy with intraoperative cholangiogram with repair of the ventral hernia at the  same time of surgery.  We will not be able to use mesh to repair the hernia due to the risk of cross-contamination from the cholecystectomy.  However, we can probably reduce the contents of the hernia laparoscopically, make a small incision over the hernia, and repair this primarily with suture.  The surgical procedure has been discussed with the patient.  Potential risks, benefits, alternative treatments, and expected outcomes have been explained.  All of the patient's questions at this time have been answered.  The likelihood of reaching the patient's treatment goal is good.  The patient understand the proposed surgical procedure and wishes to proceed.        Blaise Palladino K. 05/06/2011, 4:58 PM

## 2011-05-11 ENCOUNTER — Encounter: Payer: Self-pay | Admitting: Family Medicine

## 2011-05-14 ENCOUNTER — Encounter (HOSPITAL_COMMUNITY): Payer: Self-pay

## 2011-05-14 ENCOUNTER — Encounter (HOSPITAL_COMMUNITY)
Admission: RE | Admit: 2011-05-14 | Discharge: 2011-05-14 | Disposition: A | Discharge status: Home / Self Care | Payer: 59 | Source: Ambulatory Visit | Attending: Surgery | Admitting: Surgery

## 2011-05-14 ENCOUNTER — Ambulatory Visit (HOSPITAL_COMMUNITY)
Admission: RE | Admit: 2011-05-14 | Discharge: 2011-05-14 | Disposition: A | Discharge status: Home / Self Care | Payer: 59 | Source: Ambulatory Visit | Attending: Surgery | Admitting: Surgery

## 2011-05-14 ENCOUNTER — Other Ambulatory Visit: Payer: Self-pay

## 2011-05-14 ENCOUNTER — Encounter (HOSPITAL_COMMUNITY): Payer: Self-pay | Admitting: Pharmacy Technician

## 2011-05-14 DIAGNOSIS — Z01812 Encounter for preprocedural laboratory examination: Secondary | ICD-10-CM | POA: Insufficient documentation

## 2011-05-14 DIAGNOSIS — Z01818 Encounter for other preprocedural examination: Secondary | ICD-10-CM | POA: Insufficient documentation

## 2011-05-14 DIAGNOSIS — I10 Essential (primary) hypertension: Secondary | ICD-10-CM | POA: Insufficient documentation

## 2011-05-14 DIAGNOSIS — I700 Atherosclerosis of aorta: Secondary | ICD-10-CM | POA: Insufficient documentation

## 2011-05-14 DIAGNOSIS — K8010 Calculus of gallbladder with chronic cholecystitis without obstruction: Secondary | ICD-10-CM | POA: Insufficient documentation

## 2011-05-14 DIAGNOSIS — Z01810 Encounter for preprocedural cardiovascular examination: Secondary | ICD-10-CM | POA: Insufficient documentation

## 2011-05-14 LAB — CBC
HCT: 39.3 % (ref 36.0–46.0)
Hemoglobin: 12.8 g/dL (ref 12.0–15.0)
MCV: 93.6 fL (ref 78.0–100.0)
RBC: 4.2 MIL/uL (ref 3.87–5.11)
RDW: 14.3 % (ref 11.5–15.5)
WBC: 6.3 10*3/uL (ref 4.0–10.5)

## 2011-05-14 LAB — BASIC METABOLIC PANEL
BUN: 28 mg/dL — ABNORMAL HIGH (ref 6–23)
CO2: 33 mEq/L — ABNORMAL HIGH (ref 19–32)
Chloride: 99 mEq/L (ref 96–112)
GFR calc Af Amer: 48 mL/min — ABNORMAL LOW (ref >90)
Glucose, Bld: 90 mg/dL (ref 70–99)
Potassium: 3.7 mEq/L (ref 3.5–5.1)

## 2011-05-14 NOTE — Patient Instructions (Signed)
20 Katherine Cohen  05/14/2011   Your procedure is scheduled on:  05/22/11 1130am-255pm  Report to [facility] at 0900 AM.  Call this number if you have problems the morning of surgery: [phone]   Remember:   Do not eat food:After Midnight.  May have clear liquids:until Midnight .   Take these medicines the morning of surgery with A SIP OF WATER:    Do not wear jewelry, make-up or nail polish.  Do not wear lotions, powders, or perfumes..  Do not shave 48 hours prior to surgery.  Do not bring valuables to the hospital.  Contacts, dentures or bridgework may not be worn into surgery.  Leave suitcase in the car. After surgery it may be brought to your room.  For patients admitted to the hospital, checkout time is 11:00 AM the day of discharge.   Marland Kitchen    Special Instructions: CHG Shower Use Special Wash: 1/2 bottle night before surgery and 1/2 bottle morning of surgery. shower chin to toes with CHG.  Wash face and private parts with regular soap.    Please read over the following fact sheets that you were given: MRSA Information, coughing and deep breathing exercises, leg exercises

## 2011-05-14 NOTE — Pre-Procedure Instructions (Signed)
05/14/11 Katherine Cohen stated Dr Corliss Skains watches EPIC regarding abnormal lab results.

## 2011-05-14 NOTE — Progress Notes (Signed)
Quick Note:  This patient may proceed with surgery ______ 

## 2011-05-15 NOTE — Pre-Procedure Instructions (Signed)
05/15/11 Per Pattricia Boss per Dr Corliss Skains labs ok for surgery

## 2011-05-17 LAB — MRSA CULTURE

## 2011-05-21 ENCOUNTER — Other Ambulatory Visit: Payer: Self-pay | Admitting: *Deleted

## 2011-05-21 MED ORDER — CHLORTHALIDONE 25 MG PO TABS: 25.0000 mg | ORAL_TABLET | Freq: Every day | ORAL | Status: DC

## 2011-05-21 MED ORDER — DICLOFENAC SODIUM 75 MG PO TBEC: 75.0000 mg | DELAYED_RELEASE_TABLET | Freq: Every day | ORAL | Status: DC

## 2011-05-22 ENCOUNTER — Encounter (HOSPITAL_COMMUNITY): Payer: Self-pay | Admitting: *Deleted

## 2011-05-22 ENCOUNTER — Encounter (HOSPITAL_COMMUNITY): Payer: Self-pay | Admitting: Anesthesiology

## 2011-05-22 ENCOUNTER — Ambulatory Visit (HOSPITAL_COMMUNITY)
Admission: RE | Admit: 2011-05-22 | Discharge: 2011-05-23 | Disposition: A | Discharge status: Home / Self Care | Payer: 59 | Source: Ambulatory Visit | Attending: Surgery | Admitting: Surgery

## 2011-05-22 ENCOUNTER — Encounter (HOSPITAL_COMMUNITY)
Admission: RE | Disposition: A | Discharge status: Home / Self Care | Payer: Self-pay | Source: Ambulatory Visit | Attending: Surgery

## 2011-05-22 ENCOUNTER — Ambulatory Visit (HOSPITAL_COMMUNITY): Payer: 59

## 2011-05-22 ENCOUNTER — Ambulatory Visit (HOSPITAL_COMMUNITY): Payer: 59 | Admitting: Anesthesiology

## 2011-05-22 DIAGNOSIS — K436 Other and unspecified ventral hernia with obstruction, without gangrene: Secondary | ICD-10-CM | POA: Insufficient documentation

## 2011-05-22 DIAGNOSIS — I10 Essential (primary) hypertension: Secondary | ICD-10-CM | POA: Insufficient documentation

## 2011-05-22 DIAGNOSIS — R609 Edema, unspecified: Secondary | ICD-10-CM

## 2011-05-22 DIAGNOSIS — K432 Incisional hernia without obstruction or gangrene: Secondary | ICD-10-CM

## 2011-05-22 DIAGNOSIS — K8010 Calculus of gallbladder with chronic cholecystitis without obstruction: Secondary | ICD-10-CM

## 2011-05-22 HISTORY — PX: VENTRAL HERNIA REPAIR: SHX424

## 2011-05-22 HISTORY — PX: CHOLECYSTECTOMY: SHX55

## 2011-05-22 LAB — CREATININE, SERUM
Creatinine, Ser: 0.95 mg/dL (ref 0.50–1.10)
GFR calc non Af Amer: 59 mL/min — ABNORMAL LOW (ref >90)

## 2011-05-22 LAB — CBC
Hemoglobin: 12.8 g/dL (ref 12.0–15.0)
MCH: 30.7 pg (ref 26.0–34.0)
MCHC: 32.9 g/dL (ref 30.0–36.0)
RDW: 14.2 % (ref 11.5–15.5)

## 2011-05-22 SURGERY — LAPAROSCOPIC CHOLECYSTECTOMY WITH INTRAOPERATIVE CHOLANGIOGRAM
Anesthesia: General | Site: Abdomen | Wound class: Clean Contaminated

## 2011-05-22 MED ORDER — FENTANYL CITRATE 0.05 MG/ML IJ SOLN
INTRAMUSCULAR | Status: DC | PRN
  Administered 2011-05-22: 100 ug via INTRAVENOUS
  Administered 2011-05-22 (×3): 50 ug via INTRAVENOUS

## 2011-05-22 MED ORDER — CEFAZOLIN SODIUM 1-5 GM-% IV SOLN
1.0000 g | INTRAVENOUS | Status: AC
  Administered 2011-05-22: 1 g via INTRAVENOUS

## 2011-05-22 MED ORDER — CEFAZOLIN SODIUM 1-5 GM-% IV SOLN
1.0000 g | Freq: Four times a day (QID) | INTRAVENOUS | Status: AC
  Administered 2011-05-22 – 2011-05-23 (×3): 1 g via INTRAVENOUS
  Filled 2011-05-22 (×3): qty 50

## 2011-05-22 MED ORDER — NEOSTIGMINE METHYLSULFATE 1 MG/ML IJ SOLN
INTRAMUSCULAR | Status: DC | PRN
  Administered 2011-05-22: 4 mg via INTRAVENOUS

## 2011-05-22 MED ORDER — ACETAMINOPHEN 325 MG PO TABS: 650.0000 mg | ORAL_TABLET | ORAL | Status: DC | PRN

## 2011-05-22 MED ORDER — HYDROMORPHONE HCL PF 1 MG/ML IJ SOLN: 0.2500 mg | INTRAMUSCULAR | Status: DC | PRN

## 2011-05-22 MED ORDER — PROPOFOL 10 MG/ML IV EMUL
INTRAVENOUS | Status: DC | PRN
  Administered 2011-05-22: 100 mg via INTRAVENOUS

## 2011-05-22 MED ORDER — METOPROLOL SUCCINATE ER 50 MG PO TB24
50.0000 mg | ORAL_TABLET | Freq: Every day | ORAL | Status: DC
  Administered 2011-05-22: 50 mg via ORAL
  Filled 2011-05-22 (×2): qty 1

## 2011-05-22 MED ORDER — KETOROLAC TROMETHAMINE 15 MG/ML IJ SOLN
15.0000 mg | Freq: Four times a day (QID) | INTRAMUSCULAR | Status: DC
  Administered 2011-05-22 – 2011-05-23 (×3): 15 mg via INTRAVENOUS
  Filled 2011-05-22 (×6): qty 1

## 2011-05-22 MED ORDER — BUPIVACAINE-EPINEPHRINE 0.25% -1:200000 IJ SOLN
INTRAMUSCULAR | Status: DC | PRN
  Administered 2011-05-22: 25 mL

## 2011-05-22 MED ORDER — ENOXAPARIN SODIUM 40 MG/0.4ML ~~LOC~~ SOLN
40.0000 mg | SUBCUTANEOUS | Status: DC
  Filled 2011-05-22: qty 0.4

## 2011-05-22 MED ORDER — SODIUM CHLORIDE 0.9 % IV SOLN
INTRAVENOUS | Status: DC
  Administered 2011-05-22: 15:00:00 via INTRAVENOUS

## 2011-05-22 MED ORDER — OXYCODONE-ACETAMINOPHEN 5-325 MG PO TABS: 1.0000 | ORAL_TABLET | ORAL | Status: DC | PRN

## 2011-05-22 MED ORDER — ROCURONIUM BROMIDE 100 MG/10ML IV SOLN
INTRAVENOUS | Status: DC | PRN
  Administered 2011-05-22: 20 mg via INTRAVENOUS
  Administered 2011-05-22: 40 mg via INTRAVENOUS
  Administered 2011-05-22: 10 mg via INTRAVENOUS

## 2011-05-22 MED ORDER — CHLORTHALIDONE 25 MG PO TABS
25.0000 mg | ORAL_TABLET | Freq: Every day | ORAL | Status: DC
  Filled 2011-05-22: qty 1

## 2011-05-22 MED ORDER — SODIUM CHLORIDE 0.9 % IV SOLN
INTRAVENOUS | Status: DC | PRN
  Administered 2011-05-22: 13:00:00

## 2011-05-22 MED ORDER — LACTATED RINGERS IV SOLN
INTRAVENOUS | Status: DC | PRN
  Administered 2011-05-22 (×2): via INTRAVENOUS

## 2011-05-22 MED ORDER — 0.9 % SODIUM CHLORIDE (POUR BTL) OPTIME
TOPICAL | Status: DC | PRN
  Administered 2011-05-22: 1000 mL

## 2011-05-22 MED ORDER — ENALAPRIL MALEATE 20 MG PO TABS
20.0000 mg | ORAL_TABLET | Freq: Every day | ORAL | Status: DC
  Filled 2011-05-22: qty 1

## 2011-05-22 MED ORDER — ONDANSETRON HCL 4 MG/2ML IJ SOLN
INTRAMUSCULAR | Status: DC | PRN
  Administered 2011-05-22: 4 mg via INTRAVENOUS

## 2011-05-22 MED ORDER — OMEPRAZOLE MAGNESIUM 20 MG PO TBEC: 20.0000 mg | DELAYED_RELEASE_TABLET | Freq: Every day | ORAL | Status: DC

## 2011-05-22 MED ORDER — GLYCOPYRROLATE 0.2 MG/ML IJ SOLN
INTRAMUSCULAR | Status: DC | PRN
  Administered 2011-05-22: .6 mg via INTRAVENOUS

## 2011-05-22 MED ORDER — PANTOPRAZOLE SODIUM 40 MG PO TBEC
40.0000 mg | DELAYED_RELEASE_TABLET | Freq: Every day | ORAL | Status: DC
  Administered 2011-05-22: 40 mg via ORAL
  Filled 2011-05-22 (×2): qty 1

## 2011-05-22 MED ORDER — MORPHINE SULFATE 2 MG/ML IJ SOLN: 2.0000 mg | INTRAMUSCULAR | Status: DC | PRN

## 2011-05-22 MED ORDER — LACTATED RINGERS IR SOLN
Status: DC | PRN
  Administered 2011-05-22: 1000 mL

## 2011-05-22 MED ORDER — ETOMIDATE 2 MG/ML IV SOLN
INTRAVENOUS | Status: DC | PRN
  Administered 2011-05-22: 10 mg via INTRAVENOUS

## 2011-05-22 SURGICAL SUPPLY — 65 items
APPLIER CLIP ROT 10 11.4 M/L (STAPLE) ×2
BENZOIN TINCTURE PRP APPL 2/3 (GAUZE/BANDAGES/DRESSINGS) IMPLANT
BINDER ABD UNIV 12 45-62 (WOUND CARE) IMPLANT
BINDER ABDOMINAL 46IN 62IN (WOUND CARE)
BLADE HEX COATED 2.75 (ELECTRODE) ×2 IMPLANT
CANISTER SUCTION 2500CC (MISCELLANEOUS) ×2 IMPLANT
CHLORAPREP W/TINT 26ML (MISCELLANEOUS) ×2 IMPLANT
CLIP APPLIE ROT 10 11.4 M/L (STAPLE) ×1 IMPLANT
CLOTH BEACON ORANGE TIMEOUT ST (SAFETY) ×2 IMPLANT
CLSR STERI-STRIP ANTIMIC 1/2X4 (GAUZE/BANDAGES/DRESSINGS) ×2 IMPLANT
COVER MAYO STAND STRL (DRAPES) ×2 IMPLANT
DECANTER SPIKE VIAL GLASS SM (MISCELLANEOUS) IMPLANT
DRAIN CHANNEL 10F 3/8 F FF (DRAIN) IMPLANT
DRAIN CHANNEL RND F F (WOUND CARE) IMPLANT
DRAPE C-ARM 42X72 X-RAY (DRAPES) ×2 IMPLANT
DRAPE LAPAROSCOPIC ABDOMINAL (DRAPES) ×2 IMPLANT
DRAPE UTILITY XL STRL (DRAPES) IMPLANT
DRSG TEGADERM 2-3/8X2-3/4 SM (GAUZE/BANDAGES/DRESSINGS) ×2 IMPLANT
DRSG TEGADERM 4X4.75 (GAUZE/BANDAGES/DRESSINGS) ×2 IMPLANT
DRSG TELFA PLUS 4X6 ADH ISLAND (GAUZE/BANDAGES/DRESSINGS) ×2 IMPLANT
ELECT REM PT RETURN 9FT ADLT (ELECTROSURGICAL) ×2
ELECTRODE REM PT RTRN 9FT ADLT (ELECTROSURGICAL) ×1 IMPLANT
EVACUATOR SILICONE 100CC (DRAIN) IMPLANT
FILTER SMOKE EVAC LAPAROSHD (FILTER) IMPLANT
GLOVE BIO SURGEON STRL SZ7 (GLOVE) ×2 IMPLANT
GLOVE BIOGEL PI IND STRL 7.0 (GLOVE) ×1 IMPLANT
GLOVE BIOGEL PI IND STRL 7.5 (GLOVE) ×1 IMPLANT
GLOVE BIOGEL PI INDICATOR 7.0 (GLOVE) ×1
GLOVE BIOGEL PI INDICATOR 7.5 (GLOVE) ×1
GOWN STRL NON-REIN LRG LVL3 (GOWN DISPOSABLE) ×4 IMPLANT
GOWN STRL REIN XL XLG (GOWN DISPOSABLE) ×4 IMPLANT
KIT BASIN OR (CUSTOM PROCEDURE TRAY) ×2 IMPLANT
NEEDLE HYPO 22GX1.5 SAFETY (NEEDLE) ×2 IMPLANT
NS IRRIG 1000ML POUR BTL (IV SOLUTION) ×2 IMPLANT
PACK GENERAL/GYN (CUSTOM PROCEDURE TRAY) IMPLANT
POUCH SPECIMEN RETRIEVAL 10MM (ENDOMECHANICALS) ×2 IMPLANT
RINGERS IRRIG 1000ML POUR BTL (IV SOLUTION) IMPLANT
SET CHOLANGIOGRAPH MIX (MISCELLANEOUS) ×2 IMPLANT
SET IRRIG TUBING LAPAROSCOPIC (IRRIGATION / IRRIGATOR) ×2 IMPLANT
SOLUTION ANTI FOG 6CC (MISCELLANEOUS) ×2 IMPLANT
SPONGE GAUZE 4X4 12PLY (GAUZE/BANDAGES/DRESSINGS) IMPLANT
SPONGE LAP 18X18 X RAY DECT (DISPOSABLE) ×2 IMPLANT
STAPLER VISISTAT 35W (STAPLE) ×2 IMPLANT
STRIP CLOSURE SKIN 1/2X4 (GAUZE/BANDAGES/DRESSINGS) ×4 IMPLANT
SUT ETHILON 2 0 PS N (SUTURE) IMPLANT
SUT MNCRL AB 4-0 PS2 18 (SUTURE) ×2 IMPLANT
SUT NOVA NAB DX-16 0-1 5-0 T12 (SUTURE) ×6 IMPLANT
SUT NOVA NAB GS-21 0 18 T12 DT (SUTURE) IMPLANT
SUT PDS AB 1 CTX 36 (SUTURE) IMPLANT
SUT PROLENE 0 CT 1 CR/8 (SUTURE) IMPLANT
SUT SILK 3 0 (SUTURE) ×1
SUT SILK 3-0 18XBRD TIE 12 (SUTURE) ×1 IMPLANT
SUT VIC AB 2-0 CT1 27 (SUTURE) ×1
SUT VIC AB 2-0 CT1 27XBRD (SUTURE) ×1 IMPLANT
SUT VIC AB 3-0 SH 27 (SUTURE) ×2
SUT VIC AB 3-0 SH 27XBRD (SUTURE) ×2 IMPLANT
SYR CONTROL 10ML LL (SYRINGE) ×2 IMPLANT
TOWEL OR 17X26 10 PK STRL BLUE (TOWEL DISPOSABLE) ×2 IMPLANT
TRAY FOLEY CATH 14FRSI W/METER (CATHETERS) ×2 IMPLANT
TRAY LAP CHOLE (CUSTOM PROCEDURE TRAY) ×2 IMPLANT
TROCAR BLADELESS OPT 5 75 (ENDOMECHANICALS) ×4 IMPLANT
TROCAR XCEL BLUNT TIP 100MML (ENDOMECHANICALS) ×2 IMPLANT
TROCAR XCEL NON-BLD 11X100MML (ENDOMECHANICALS) ×2 IMPLANT
TUBING INSUFFLATION 10FT LAP (TUBING) ×2 IMPLANT
YANKAUER SUCT BULB TIP 10FT TU (MISCELLANEOUS) ×2 IMPLANT

## 2011-05-22 NOTE — Transfer of Care (Signed)
Immediate Anesthesia Transfer of Care Note  Patient: Katherine Cohen  Procedure(s) Performed: Procedure(s) (LRB): LAPAROSCOPIC CHOLECYSTECTOMY WITH INTRAOPERATIVE CHOLANGIOGRAM (N/A) HERNIA REPAIR VENTRAL ADULT (N/A)  Patient Location: PACU  Anesthesia Type: General  Level of Consciousness: awake, sedated and patient cooperative  Airway & Oxygen Therapy: Patient Spontanous Breathing and Patient connected to face mask oxygen  Post-op Assessment: Report given to PACU RN and Post -op Vital signs reviewed and stable  Post vital signs: Reviewed and stable  Complications: No apparent anesthesia complications

## 2011-05-22 NOTE — Discharge Instructions (Signed)
[facility] °LAPAROSCOPIC SURGERY: POST OP INSTRUCTIONS °Always review your discharge instruction sheet given to you by the facility where your surgery was performed. °IF YOU HAVE DISABILITY OR FAMILY LEAVE FORMS, YOU MUST BRING THEM TO THE OFFICE FOR PROCESSING.   °DO NOT GIVE THEM TO YOUR DOCTOR. ° °1. A prescription for pain medication will be given to you upon discharge.  Take your pain medication as prescribed, if needed.  If narcotic pain medicine is not needed, then you may take acetaminophen (Tylenol) or ibuprofen (Advil) as needed. °2. Take your usually prescribed medications unless otherwise directed. °3. If you need a refill on your pain medication, please contact your pharmacy.  They will contact our office to request authorization. Prescriptions will not be filled after 5pm or on week-ends. °4. You should follow a light diet the first few days after arrival home, such as soup and crackers, etc.  Be sure to include lots of fluids daily. °5. Most patients will experience some swelling and bruising in the area of the incisions.  Ice packs will help.  Swelling and bruising can take several days to resolve.  °6. It is common to experience some constipation if taking pain medication after surgery.  Increasing fluid intake and taking a stool softener (such as Colace) will usually help or prevent this problem from occurring.  A mild laxative (Milk of Magnesia or Miralax) should be taken according to package instructions if there are no bowel movements after 48 hours. °7. Unless discharge instructions indicate otherwise, you may remove your bandages 48 hours after surgery, and you may shower at that time.  You will have steri-strips (small skin tapes) in place directly over the incision.  These strips should be left on the skin for 7-10 days.  °8. ACTIVITIES:  You may resume regular (light) daily activities beginning the next day--such as daily self-care, walking, climbing stairs--gradually  increasing activities as tolerated.  You may have sexual intercourse when it is comfortable.  Refrain from any heavy lifting or straining until approved by your doctor. °a. You may drive when you are no longer taking prescription pain medication, you can comfortably wear a seatbelt, and you can safely maneuver your car and apply brakes. °b. RETURN TO WORK:   2-3 weeks °9. You should see your doctor in the office for a follow-up appointment approximately 2-3 weeks after your surgery.  Make sure that you call for this appointment within a day or two after you arrive home to insure a convenient appointment time. °10. OTHER INSTRUCTIONS: ________________________________________________________________________ °WHEN TO CALL YOUR DOCTOR: °1. Fever over 101.0 °2. Inability to urinate °3. Continued bleeding from incision. °4. Increased pain, redness, or drainage from the incision. °5. Increasing abdominal pain ° °The clinic staff is available to answer your questions during regular business hours.  Please don’t hesitate to call and ask to speak to one of the nurses for clinical concerns.  If you have a medical emergency, go to the nearest emergency room or call 911.  A surgeon from [facility] is always on call at the hospital. °[address], [address], [place], [place]  [address] ? [address] [address], [place], [place]   [address] °[phone] ? [phone] ? FAX [phone] °Web site: [email or web address] ° °

## 2011-05-22 NOTE — Progress Notes (Signed)
Pt ambulated to the restroom.  Pt was very motivated for early mobilization.  She required standby asst only.  She was, however, slightly unsteady on feet.  She will require asst for ambulation and elimination needs until she progresses with recovery.  Pt verbalized understanding and agreed to call for asst when needing to get up.

## 2011-05-22 NOTE — Anesthesia Preprocedure Evaluation (Addendum)
Anesthesia Evaluation  Patient identified by MRN, date of birth, ID band Patient awake    Reviewed: Allergy & Precautions, H&P , NPO status , Patient's Chart, lab work & pertinent test results, reviewed documented beta blocker date and time   Airway Mallampati: II TM Distance: >3 FB Neck ROM: Full    Dental  (+) Teeth Intact and Dental Advisory Given   Pulmonary neg pulmonary ROS,  Hx tonsilar cancer breath sounds clear to auscultation        Cardiovascular hypertension, Pt. on medications Rhythm:Regular Rate:Normal  Denies cardiac symptoms   Neuro/Psych negative neurological ROS  negative psych ROS   GI/Hepatic Neg liver ROS, gallstones   Endo/Other  negative endocrine ROS  Renal/GU Cr 1.26  negative genitourinary   Musculoskeletal   Abdominal   Peds negative pediatric ROS (+)  Hematology negative hematology ROS (+)   Anesthesia Other Findings Teeth soft from prior radiation Rx  Reproductive/Obstetrics negative OB ROS                         Anesthesia Physical Anesthesia Plan  ASA: II  Anesthesia Plan: General   Post-op Pain Management:    Induction: Intravenous  Airway Management Planned: Oral ETT  Additional Equipment:   Intra-op Plan:   Post-operative Plan: Extubation in OR  Informed Consent: I have reviewed the patients History and Physical, chart, labs and discussed the procedure including the risks, benefits and alternatives for the proposed anesthesia with the patient or authorized representative who has indicated his/her understanding and acceptance.   Dental advisory given  Plan Discussed with: CRNA and Surgeon  Anesthesia Plan Comments:         Anesthesia Quick Evaluation

## 2011-05-22 NOTE — Interval H&P Note (Signed)
History and Physical Interval Note:  05/22/2011 11:30 AM  Katherine Cohen  has presented today for surgery, with the diagnosis of Chronic calculus cholecystitus: ventral hernia  The various methods of treatment have been discussed with the patient and family. After consideration of risks, benefits and other options for treatment, the patient has consented to  Procedure(s) (LRB): LAPAROSCOPIC CHOLECYSTECTOMY WITH INTRAOPERATIVE CHOLANGIOGRAM (N/A) HERNIA REPAIR VENTRAL ADULT (N/A) as a surgical intervention .  The patients' history has been reviewed, patient examined, no change in status, stable for surgery.  I have reviewed the patients' chart and labs.  Questions were answered to the patient's satisfaction.     Haunani Dickard K.

## 2011-05-22 NOTE — H&P (View-Only) (Signed)
Quick Note:  This patient may proceed with surgery ______ 

## 2011-05-22 NOTE — Anesthesia Postprocedure Evaluation (Signed)
  Anesthesia Post-op Note  Patient: Katherine Cohen  Procedure(s) Performed: Procedure(s) (LRB): LAPAROSCOPIC CHOLECYSTECTOMY WITH INTRAOPERATIVE CHOLANGIOGRAM (N/A) HERNIA REPAIR VENTRAL ADULT (N/A)  Patient Location: PACU  Anesthesia Type: General  Level of Consciousness: oriented and sedated  Airway and Oxygen Therapy: Patient Spontanous Breathing and Patient connected to nasal cannula oxygen  Post-op Pain: mild  Post-op Assessment: Post-op Vital signs reviewed, Patient's Cardiovascular Status Stable, Respiratory Function Stable and Patent Airway  Post-op Vital Signs: stable  Complications: No apparent anesthesia complications

## 2011-05-22 NOTE — Op Note (Signed)
Procedure Note  Indications: This patient presents with symptomatic gallbladder disease and will undergo laparoscopic cholecystectomy.  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  Pre-operative Diagnosis: Calculus of gallbladder with other cholecystitis, without mention of obstruction     Right lower quadrant Spigelian ventral hernia  Post-operative Diagnosis: Same  Procedure:  Laparoscopic cholecystectomy with intraoperative cholangiogram Ventral hernia with primary closure Surgeon: Bryley Chrisman K.   Assistants: none   Anesthesia: General endotracheal anesthesia  ASA Class: 2  Procedure Details  The patient was seen again in the Holding Room. The risks, benefits, complications, treatment options, and expected outcomes were discussed with the patient. The possibilities of reaction to medication, pulmonary aspiration, perforation of viscus, bleeding, recurrent infection, finding a normal gallbladder, the need for additional procedures, failure to diagnose a condition, the possible need to convert to an open procedure, and creating a complication requiring transfusion or operation were discussed with the patient. The likelihood of improving the patient's symptoms with return to their baseline status is good.  The patient and/or family concurred with the proposed plan, giving informed consent. The site of surgery properly noted. The patient was taken to Operating Room, identified as Katherine Cohen and the procedure verified as Laparoscopic Cholecystectomy with Intraoperative Cholangiogram and ventral hernia repair. A Time Out was held and the above information confirmed.  Prior to the induction of general anesthesia, antibiotic prophylaxis was administered. General endotracheal anesthesia was then administered and tolerated well. After the induction, the abdomen was prepped with Chloraprep and draped in the sterile fashion. The patient  was positioned in the supine position.  Local anesthetic agent was injected into the skin near the umbilicus and an incision made. We dissected down to the abdominal fascia with blunt dissection.  The fascia was incised vertically and we entered the peritoneal cavity bluntly.  A pursestring suture of 0-Vicryl was placed around the fascial opening.  The Hasson cannula was inserted and secured with the stay suture.  Pneumoperitoneum was then created with CO2 and tolerated well without any adverse changes in the patient's vital signs. An 11-mm port was placed in the subxiphoid position.  Two 5-mm ports were placed in the right upper quadrant. All skin incisions were infiltrated with a local anesthetic agent before making the incision and placing the trocars.   We positioned the patient in reverse Trendelenburg, tilted slightly to the patient's left.  The gallbladder was identified, the fundus grasped and retracted cephalad. Adhesions were lysed bluntly and with the electrocautery where indicated, taking care not to injure any adjacent organs or viscus. The infundibulum was grasped and retracted laterally, exposing the peritoneum overlying the triangle of Calot. This was then divided and exposed in a blunt fashion. A critical view of the cystic duct and cystic artery was obtained.  The cystic duct was clearly identified and bluntly dissected circumferentially. The cystic duct was ligated with a clip distally.   An incision was made in the cystic duct and the [facility] cholangiogram catheter introduced. The catheter was secured using a clip. A cholangiogram was then obtained which showed good visualization of the distal and proximal biliary tree with no sign of filling defects or obstruction.  Contrast flowed easily into the duodenum. The catheter was then removed.   The cystic duct was then ligated with clips and divided. The cystic artery was identified, dissected free, ligated with clips and divided as well.   The  gallbladder was dissected from the  liver bed in retrograde fashion with the electrocautery. The gallbladder was removed and placed in an Endocatch sac. The liver bed was irrigated and inspected. Hemostasis was achieved with the electrocautery. Copious irrigation was utilized and was repeatedly aspirated until clear.  The gallbladder and Endocatch sac were then removed through the umbilical port site. The Hasson cannula was then reinserted.  We then turned our attention to the right lower quadrant.  Bowel was seen protruding through a hernia defect in the right lower quadrant.  We were able to reduce the cecum and small bowel out of this hernia.  The defect seemed to be only about 2-3 cm across.  I made a transverse incision over the hernia.  We dissected down to the hernia sac and removed a large portion of this very large hernia sac.  We exposed the fascial edges.  We chose not to use mesh to repair the hernia due to the risk of cross-contamination with the cholecystectomy.  The fascia was closed in two layers with interrupted figure-of-eight 1 Novofil sutures.  We then insufflated CO2 to insect the closure.  No airleak or bulging was noted.  Pneumoperitoneum was released as we removed the trocars.  4-0 Monocryl was used to close the skin at all of the incisions.   Benzoin, steri-strips, and clean dressings were applied. The patient was then extubated and brought to the recovery room in stable condition. Instrument, sponge, and needle counts were correct at closure and at the conclusion of the case.   Findings: Cholecystitis with Cholelithiasis 2 cm defect in RLQ with large amount of herniated bowel and cecum Estimated Blood Loss: Minimal         Drains: Foley - removed at tend of case         Specimens: Gallbladder                        Hernia sac Complications: None; patient tolerated the procedure well.         Disposition: PACU - hemodynamically stable.         Condition: stable   Wilmon Arms. Corliss Skains, MD, [facility]  05/22/2011 2:11 PM

## 2011-05-23 MED ORDER — OXYCODONE-ACETAMINOPHEN 5-325 MG PO TABS: 1.0000 | ORAL_TABLET | ORAL | Status: AC | PRN

## 2011-05-23 MED ORDER — SODIUM CHLORIDE 0.9 % IV BOLUS (SEPSIS)
1000.0000 mL | Freq: Once | INTRAVENOUS | Status: AC
  Administered 2011-05-23: 1000 mL via INTRAVENOUS

## 2011-05-23 NOTE — Discharge Summary (Signed)
Physician Discharge Summary  Patient ID: Katherine Cohen MRN: [id number] DOB/AGE: March 17, 1940 71 y.o.  Admit date: 05/22/2011 Discharge date: 05/23/2011  Admission Diagnoses:  Chronic cholecystitis  Discharge Diagnoses:  same  Active Problems:  * No active hospital problems. *    Surgery:  Laparoscopic cholecystectomy with intraoperative cholangiogram  Ventral hernia with primary closure   Discharged Condition: improved  Hospital Course:   Had surgery and kept overnight.  Did well and ready for discharge  Consults: none  Significant Diagnostic Studies: none    Discharge Exam: Blood pressure 138/72, pulse 60, temperature 98.9 F (37.2 C), temperature source Oral, resp. rate 18, height 5\' 5"  (1.651 m), weight 165 lb (74.844 kg), SpO2 97.00%. incisons dressing.  Usually degree of postop discomfort.  Able to get up and around satisfactorily  Disposition:   Discharge Orders    Future Orders Please Complete By Expires   Diet - low sodium heart healthy      Increase activity slowly      Discharge instructions      Comments:   May shower and leave dressings open at home     Medication List  As of 05/23/2011  8:47 AM   TAKE these medications         aspirin 81 MG tablet   Take 81 mg by mouth daily.      chlorthalidone 25 MG tablet   Commonly known as: HYGROTON   Take 1 tablet (25 mg total) by mouth daily after breakfast.      diclofenac 75 MG EC tablet   Commonly known as: VOLTAREN   Take 1 tablet (75 mg total) by mouth daily after breakfast.      enalapril 20 MG tablet   Commonly known as: VASOTEC   Take 20 mg by mouth daily after breakfast.      metoprolol succinate 50 MG 24 hr tablet   Commonly known as: TOPROL-XL   Take 50 mg by mouth every evening. Take with or immediately following a meal.      omeprazole 20 MG tablet   Commonly known as: PRILOSEC OTC   Take 20 mg by mouth daily.      oxyCODONE-acetaminophen 5-325 MG per tablet   Commonly known as:  PERCOCET   Take 1-2 tablets by mouth every 4 (four) hours as needed.      pravastatin 20 MG tablet   Commonly known as: PRAVACHOL   Take 1 tablet (20 mg total) by mouth at bedtime.           Follow-up Information    Follow up with TSUEI,Catheryn Slifer K., MD. Schedule an appointment as soon as possible for a visit in 3 weeks.   Contact information:   [employer] [address] [place] [address] [phone]          Signed: Valarie Merino 05/23/2011, 8:47 AM  2

## 2011-05-23 NOTE — Progress Notes (Signed)
Pt discharged to home provided discharge instructions and prescriptions along with handouts. Pt verbalized understanding of discharge information. Pt stable. Pt transported by tech IV removed and documented. Kano Heckmann Howell, RN    

## 2011-05-23 NOTE — Progress Notes (Signed)
Pt voided with 300 residual on bladder scan. Notified dr Daphine Deutscher. Pt then voided 150 with 300 residual notified dr Daphine Deutscher he advised okay to discharge home advised pt to take a warm bath and allow urine to flow if she has difficulty urinating per dr Daphine Deutscher instructions. Annitta Needs, RN

## 2011-05-25 ENCOUNTER — Encounter (HOSPITAL_COMMUNITY): Payer: Self-pay | Admitting: Surgery

## 2011-05-28 ENCOUNTER — Other Ambulatory Visit: Payer: Self-pay | Admitting: *Deleted

## 2011-05-28 MED ORDER — CHLORTHALIDONE 25 MG PO TABS: 25.0000 mg | ORAL_TABLET | Freq: Every day | ORAL | Status: DC

## 2011-05-28 MED ORDER — ENALAPRIL MALEATE 20 MG PO TABS: 20.0000 mg | ORAL_TABLET | Freq: Every day | ORAL | Status: DC

## 2011-05-28 MED ORDER — OMEPRAZOLE MAGNESIUM 20 MG PO TBEC: 20.0000 mg | DELAYED_RELEASE_TABLET | Freq: Every day | ORAL | Status: DC

## 2011-05-28 MED ORDER — PRAVASTATIN SODIUM 20 MG PO TABS: 20.0000 mg | ORAL_TABLET | Freq: Every day | ORAL | Status: DC

## 2011-05-28 MED ORDER — DICLOFENAC SODIUM 75 MG PO TBEC: 75.0000 mg | DELAYED_RELEASE_TABLET | Freq: Every day | ORAL | Status: DC

## 2011-05-28 MED ORDER — METOPROLOL SUCCINATE ER 50 MG PO TB24: 50.0000 mg | ORAL_TABLET | Freq: Every evening | ORAL | Status: DC

## 2011-06-03 ENCOUNTER — Encounter: Payer: Self-pay | Admitting: Family Medicine

## 2011-06-09 ENCOUNTER — Encounter (INDEPENDENT_AMBULATORY_CARE_PROVIDER_SITE_OTHER): Payer: Self-pay | Admitting: Surgery

## 2011-06-09 ENCOUNTER — Ambulatory Visit (INDEPENDENT_AMBULATORY_CARE_PROVIDER_SITE_OTHER): Payer: 59 | Admitting: Surgery

## 2011-06-09 VITALS — BP 142/80 | HR 72 | Temp 96.7°F | Ht 63.5 in | Wt 160.2 lb

## 2011-06-09 DIAGNOSIS — K439 Ventral hernia without obstruction or gangrene: Secondary | ICD-10-CM

## 2011-06-09 DIAGNOSIS — K8010 Calculus of gallbladder with chronic cholecystitis without obstruction: Secondary | ICD-10-CM

## 2011-06-09 NOTE — Progress Notes (Signed)
S/p lap chole with IOC on 3/13 as well as open repair of an incarcerated RLQ spigelian hernia.  She complains of soreness mostly around her RLQ incision.  Appetite and bowel movements are normal. This hernia defect was only about 2 cm across and was repaired with suture.  Pathology showed only chronic calculus cholecystitis.  Her incisions are well-healed with no sign of infection.  No sign of recurrent hernia.    She should refrain from any heavy lifting for the next 2 weeks, then may resume full activity.  Follow-up PRN.  Wilmon Arms. Corliss Skains, MD, [facility]  06/09/2011 3:50 PM

## 2011-08-14 ENCOUNTER — Other Ambulatory Visit: Payer: Self-pay | Admitting: *Deleted

## 2011-08-14 ENCOUNTER — Other Ambulatory Visit: Payer: Self-pay | Admitting: Family Medicine

## 2011-08-14 MED ORDER — PRAVASTATIN SODIUM 20 MG PO TABS: 20.0000 mg | ORAL_TABLET | Freq: Every day | ORAL | Status: DC

## 2011-09-09 ENCOUNTER — Other Ambulatory Visit: Payer: Self-pay | Admitting: *Deleted

## 2011-09-09 MED ORDER — CHLORTHALIDONE 25 MG PO TABS: 25.0000 mg | ORAL_TABLET | Freq: Every day | ORAL | Status: DC

## 2011-11-24 ENCOUNTER — Other Ambulatory Visit: Payer: Self-pay | Admitting: *Deleted

## 2011-11-24 MED ORDER — ENALAPRIL MALEATE 20 MG PO TABS: 20.0000 mg | ORAL_TABLET | Freq: Every day | ORAL | Status: DC

## 2011-11-25 ENCOUNTER — Telehealth: Payer: Self-pay | Admitting: Family Medicine

## 2011-11-25 MED ORDER — ENALAPRIL MALEATE 20 MG PO TABS: 20.0000 mg | ORAL_TABLET | Freq: Every day | ORAL | Status: DC

## 2011-11-25 NOTE — Telephone Encounter (Signed)
rx sent

## 2011-11-25 NOTE — Telephone Encounter (Signed)
Patient called request to have Enalapril 20mg  sent to [facility] on [address] In [place]. Pt's insurance changed since she is retired and she request to know if she can have 90 day refill-vew

## 2012-01-15 ENCOUNTER — Other Ambulatory Visit: Payer: Self-pay | Admitting: *Deleted

## 2012-01-15 MED ORDER — CHLORTHALIDONE 25 MG PO TABS: 25.0000 mg | ORAL_TABLET | Freq: Every day | ORAL | Status: DC

## 2012-01-15 MED ORDER — DICLOFENAC SODIUM 75 MG PO TBEC: 75.0000 mg | DELAYED_RELEASE_TABLET | Freq: Every day | ORAL | Status: DC

## 2012-04-18 ENCOUNTER — Other Ambulatory Visit: Payer: Self-pay | Admitting: Family Medicine

## 2012-04-20 ENCOUNTER — Ambulatory Visit (INDEPENDENT_AMBULATORY_CARE_PROVIDER_SITE_OTHER): Payer: 59 | Admitting: Family Medicine

## 2012-04-20 ENCOUNTER — Encounter: Payer: Self-pay | Admitting: Family Medicine

## 2012-04-20 VITALS — BP 159/80 | HR 68 | Ht 63.0 in | Wt 173.0 lb

## 2012-04-20 DIAGNOSIS — I10 Essential (primary) hypertension: Secondary | ICD-10-CM

## 2012-04-20 DIAGNOSIS — C44319 Basal cell carcinoma of skin of other parts of face: Secondary | ICD-10-CM

## 2012-04-20 DIAGNOSIS — K219 Gastro-esophageal reflux disease without esophagitis: Secondary | ICD-10-CM

## 2012-04-20 DIAGNOSIS — M1710 Unilateral primary osteoarthritis, unspecified knee: Secondary | ICD-10-CM

## 2012-04-20 DIAGNOSIS — E785 Hyperlipidemia, unspecified: Secondary | ICD-10-CM

## 2012-04-20 DIAGNOSIS — C44301 Unspecified malignant neoplasm of skin of nose: Secondary | ICD-10-CM | POA: Insufficient documentation

## 2012-04-20 MED ORDER — LISINOPRIL-HYDROCHLOROTHIAZIDE 20-12.5 MG PO TABS: 1.0000 | ORAL_TABLET | Freq: Two times a day (BID) | ORAL | Status: DC

## 2012-04-20 MED ORDER — DICLOFENAC SODIUM 75 MG PO TBEC: 75.0000 mg | DELAYED_RELEASE_TABLET | Freq: Every day | ORAL | Status: DC

## 2012-04-20 MED ORDER — PRAVASTATIN SODIUM 20 MG PO TABS: 20.0000 mg | ORAL_TABLET | Freq: Every day | ORAL | Status: DC

## 2012-04-20 MED ORDER — METOPROLOL TARTRATE 50 MG PO TABS: 50.0000 mg | ORAL_TABLET | Freq: Two times a day (BID) | ORAL | Status: DC

## 2012-04-20 MED ORDER — OMEPRAZOLE MAGNESIUM 20 MG PO TBEC: 20.0000 mg | DELAYED_RELEASE_TABLET | Freq: Every day | ORAL | Status: DC

## 2012-04-20 NOTE — Progress Notes (Signed)
  Subjective:    Patient ID: Katherine Cohen, female    DOB: [name] 02, 1940, 72 y.o.   MRN: [id number]  HPI HTN-  Pt denies chest pain, SOB, dizziness, or heart palpitations.  Taking meds as directed w/o problems.  Denies medication side effects.  Not taking her metoprolol. Started going over her medications she wasn't sure what that medication was and says she wasn't taking it. He was last prescribed back in April. The most likely explains why her blood pressures been high recently. She has chronic ankle swelling but it has not been worse lately.  She's only getting an update. She's had a ventral hernia repair as well as her gallbladder removed back in April of last year. She also has osteoporosis of the knees and likely needs a knee replacement because she's not interested in surgery at this time. She also notes that she had surgery for a skin cancer on her nose. She's not sure what kind it was. But they did have to do a skin graft.   She's currently paying out of pocket for her medications. She denied that for Medicare part D. She would like me to look of her medications and see what we can change that might be covered on the $4 list at [facility]  Hyperlipidemia-tolerating her statin well without any side effects. She does need refills sent to pharmacy.  GERD-she takes her Prilosec daily. Symptoms under good control. She did have her gallbladder removed in March.  Review of Systems     Objective:   Physical Exam  Constitutional: She is oriented to person, place, and time. She appears well-developed and well-nourished.  HENT:  Head: Normocephalic and atraumatic.  Cardiovascular: Normal rate, regular rhythm and normal heart sounds.   No carotid or abdominal bruits.  Pulmonary/Chest: Effort normal and breath sounds normal.  Neurological: She is alert and oriented to person, place, and time.  Skin: Skin is warm and dry.  Psychiatric: She has a normal mood and affect. Her behavior is normal.           Assessment & Plan:  Hypertension-uncontrolled. I would like to restart metoprolol. She's on 50 mg extended release I will change to metoprolol tartrate which will be more affordable per her insurance. I will also stop the enalapril and the chlorthalidone. She says the chlorthalidone was the most $50. Change to lisinopril/HCTZ. One in the morning one in the evening. I like to see her back in 6 weeks to make sure her blood pressures getting back under control.  Knee osteoarthritis-she's not interested in surgery at this time.  Hyperlipidemia-tolerating her pravastatin well. New prescription sent to pharmacy. Call if it is too costly as well since she is paying out of pocket for her medications. She does not have Medicare part D. Due to recheck lipid levels. Has been almost 2 years since she had blood work.  GERD-her symptoms are under good control on her Prevacid. She would like refill written for 90 day supply.

## 2012-04-21 LAB — COMPLETE METABOLIC PANEL WITH GFR
ALT: 16 U/L (ref 0–35)
AST: 20 U/L (ref 0–37)
CO2: 32 mEq/L (ref 19–32)
Chloride: 100 mEq/L (ref 96–112)
Creat: 0.84 mg/dL (ref 0.50–1.10)
GFR, Est African American: 80 mL/min
Sodium: 141 mEq/L (ref 135–145)
Total Bilirubin: 0.7 mg/dL (ref 0.3–1.2)
Total Protein: 7.2 g/dL (ref 6.0–8.3)

## 2012-04-21 LAB — LIPID PANEL
HDL: 68 mg/dL (ref >39)
LDL Cholesterol: 165 mg/dL — ABNORMAL HIGH (ref 0–99)
Triglycerides: 100 mg/dL (ref <150)
VLDL: 20 mg/dL (ref 0–40)

## 2012-05-30 ENCOUNTER — Ambulatory Visit (INDEPENDENT_AMBULATORY_CARE_PROVIDER_SITE_OTHER): Payer: Medicare Other | Admitting: Family Medicine

## 2012-05-30 ENCOUNTER — Encounter: Payer: Self-pay | Admitting: Family Medicine

## 2012-05-30 VITALS — BP 122/68 | HR 58 | Ht 62.75 in | Wt 170.0 lb

## 2012-05-30 DIAGNOSIS — E785 Hyperlipidemia, unspecified: Secondary | ICD-10-CM

## 2012-05-30 DIAGNOSIS — I10 Essential (primary) hypertension: Secondary | ICD-10-CM

## 2012-05-30 LAB — BASIC METABOLIC PANEL WITH GFR
BUN: 31 mg/dL — ABNORMAL HIGH (ref 6–23)
Chloride: 100 mEq/L (ref 96–112)
Creat: 1.25 mg/dL — ABNORMAL HIGH (ref 0.50–1.10)
Glucose, Bld: 99 mg/dL (ref 70–99)
Potassium: 4.4 mEq/L (ref 3.5–5.3)

## 2012-05-30 NOTE — Progress Notes (Signed)
  Subjective:    Patient ID: Katherine Cohen, female    DOB: December 01, 1940, 72 y.o.   MRN: [id number]  HPI  HTN -  Pt denies chest pain, SOB, dizziness, or heart palpitations.  Taking meds as directed w/o problems.  Denies medication side effects.  Tolerating new meds well.  Much cheaper.  She is eating much better, has cut salt out of her diet, and is also trying to work on losing weight. She started lost 3 pounds.   Review of Systems     Objective:   Physical Exam  Constitutional: She is oriented to person, place, and time. She appears well-developed and well-nourished.  HENT:  Head: Normocephalic and atraumatic.  Cardiovascular: Normal rate, regular rhythm and normal heart sounds.   Pulmonary/Chest: Effort normal and breath sounds normal.  Neurological: She is alert and oriented to person, place, and time.  Skin: Skin is warm and dry.  Psychiatric: She has a normal mood and affect. Her behavior is normal.          Assessment & Plan:  HTN- due to check BMP today. Since we did change her diuretic around. Blood pressure looks absolutely fantastic today. Continue current regimen followup in 4 months.  Hyperlipidemia-her cholesterol was elevated earlier this year but she decided she really wanted to work on diet and exercise instead of adjusting her cholesterol pill. She is Re: lost 3 pounds which is fantastic and she is really cut salt out of her diet. We will recheck her lipids at follow up in August.

## 2012-05-31 ENCOUNTER — Other Ambulatory Visit: Payer: Self-pay | Admitting: *Deleted

## 2012-05-31 DIAGNOSIS — I10 Essential (primary) hypertension: Secondary | ICD-10-CM

## 2012-06-07 ENCOUNTER — Other Ambulatory Visit: Payer: Self-pay | Admitting: *Deleted

## 2012-06-07 DIAGNOSIS — I10 Essential (primary) hypertension: Secondary | ICD-10-CM

## 2012-06-07 LAB — BASIC METABOLIC PANEL
BUN: 30 mg/dL — ABNORMAL HIGH (ref 6–23)
Chloride: 99 mEq/L (ref 96–112)
Potassium: 4.4 mEq/L (ref 3.5–5.3)
Sodium: 138 mEq/L (ref 135–145)

## 2012-09-06 ENCOUNTER — Other Ambulatory Visit: Payer: Self-pay | Admitting: Family Medicine

## 2012-10-10 ENCOUNTER — Other Ambulatory Visit: Payer: Self-pay | Admitting: Family Medicine

## 2012-10-11 ENCOUNTER — Other Ambulatory Visit: Payer: Self-pay | Admitting: Family Medicine

## 2012-10-11 NOTE — Telephone Encounter (Signed)
I called pharmacy and verified this patient picked up #90 tabs of Diclofenac on 09/06/2012 which should last her til October

## 2012-10-13 ENCOUNTER — Other Ambulatory Visit: Payer: Self-pay | Admitting: Family Medicine

## 2012-11-17 ENCOUNTER — Ambulatory Visit (INDEPENDENT_AMBULATORY_CARE_PROVIDER_SITE_OTHER): Payer: Medicare Other | Admitting: Family Medicine

## 2012-11-17 ENCOUNTER — Encounter: Payer: Self-pay | Admitting: Family Medicine

## 2012-11-17 VITALS — BP 138/84 | HR 72 | Wt 173.0 lb

## 2012-11-17 DIAGNOSIS — M545 Low back pain: Secondary | ICD-10-CM

## 2012-11-17 NOTE — Progress Notes (Signed)
Subjective:    Patient ID: Katherine Cohen, female    DOB: 06-27-1940, 72 y.o.   MRN: [id number]  HPI has been going on for years she is now retired and has time to take care of this.  Pt has been referred for PT in the past but never went.  Back pain is bilat and doesn't radiates down into her legs.  Had ainjury to right lower leg but no know back injuries. Uses topical bengay and a belt for support.  Worse with bending over repeatedly.  Has to sit down and rest.  Better with rest.  No radicular symptoms. No numbness or tone in the legs.    Review of Systems  BP 138/84  Pulse 72  Wt 173 lb (78.472 kg)  BMI 30.88 kg/m2    No Known Allergies  Past Medical History  Diagnosis Date  . OA (osteoarthritis) of knee   . Hypertension   . 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 GERD (gastroesophageal reflux disease)   . Edema     leg  . Hyperlipidemia   . Asthma     childhood asthma    Past Surgical History  Procedure Laterality Date  . Neck dissection      L  . Other surgical history      right ankle ulcerated wound I and D   . Hernia repair      left  inguinal hernia repair   . Cholecystectomy  05/22/2011    Procedure: LAPAROSCOPIC CHOLECYSTECTOMY WITH INTRAOPERATIVE CHOLANGIOGRAM;  Surgeon: Wilmon Arms. Corliss Skains, MD;  Location: [facility];  Service: General;  Laterality: N/A;  . Ventral hernia repair  05/22/2011    Procedure: HERNIA REPAIR VENTRAL ADULT;  Surgeon: Wilmon Arms. Corliss Skains, MD;  Location: [facility];  Service: General;  Laterality: N/A;    History   Social History  . Marital Status: Married    Spouse Name: N/A    Number of Children: N/A  . Years of Education: N/A   Occupational History  . Not on file.   Social History Main Topics  . Smoking status: Never Smoker   . Smokeless tobacco: Never Used  . Alcohol Use: 4.2 oz/week    7 Glasses of wine per week     Comment: red wine   . Drug Use: No  . Sexual Activity:    Other Topics Concern  . Not on file   Social History  Narrative  . No narrative on file    Family History  Problem Relation Age of Onset  . Stroke Mother   . Heart disease Father     AMI  . Lung cancer Brother     lung    Outpatient Encounter Prescriptions as of 11/17/2012  Medication Sig Dispense Refill  . aspirin 81 MG tablet Take 81 mg by mouth daily.        . diclofenac (VOLTAREN) 75 MG EC tablet TAKE ONE TABLET BY MOUTH ONCE DAILY AFTER  BREAKFAST.  90 tablet  0  . lisinopril-hydrochlorothiazide (PRINZIDE,ZESTORETIC) 20-12.5 MG per tablet TAKE ONE TABLET BY MOUTH TWICE DAILY  180 tablet  0  . metoprolol (LOPRESSOR) 50 MG tablet Take 1 tablet (50 mg total) by mouth 2 (two) times daily.  180 tablet  0  . omeprazole (PRILOSEC OTC) 20 MG tablet Take 1 tablet (20 mg total) by mouth daily.  90 tablet  1  . pravastatin (PRAVACHOL) 20 MG tablet Take 1 tablet (20 mg total) by mouth at  bedtime.  90 tablet  1   No facility-administered encounter medications on file as of 11/17/2012.          Objective:   Physical Exam  Constitutional: She is oriented to person, place, and time. She appears well-developed and well-nourished.  HENT:  Head: Normocephalic and atraumatic.  Musculoskeletal:  Decreased flexion to about 40. Decreased extension. She also has decreased rotation right and left but it is symmetric. She does have some mild tenderness over the lower lumbar spine as well as bilateral SI joints. Negative straight leg raise bilaterally. Hip, knee, ankle strength is 5 out of 5 bilaterally. Patellar reflexes are 2+ bilaterally. Some chronic ankle edema.  Neurological: She is alert and oriented to person, place, and time.  Skin: Skin is warm and dry.  Psychiatric: She has a normal mood and affect. Her behavior is normal.          Assessment & Plan:  Low back pain-she has no radicular symptoms which is reassuring. She does have significant stiffness. She has no history of significant osteoporosis in both knees. I suspect she probably  has some significant osteoarthritis in the low back. Will refer for physical therapy. Like to see her back in 6 weeks to see how she's doing. If she's only progressing minimally or not at all they recommend imaging with x-rays and working up further at that point. The diclofenac does seem to help her pain sometimes it can continue to use this as well as the rub. Ciardi has a belt at home that she uses sometimes for support.

## 2012-11-24 ENCOUNTER — Ambulatory Visit (INDEPENDENT_AMBULATORY_CARE_PROVIDER_SITE_OTHER): Payer: Medicare Other | Admitting: Physical Therapy

## 2012-11-24 DIAGNOSIS — M545 Low back pain, unspecified: Secondary | ICD-10-CM

## 2012-11-24 DIAGNOSIS — M6281 Muscle weakness (generalized): Secondary | ICD-10-CM

## 2012-11-24 DIAGNOSIS — M2560 Stiffness of unspecified joint, not elsewhere classified: Secondary | ICD-10-CM

## 2012-11-28 ENCOUNTER — Encounter (INDEPENDENT_AMBULATORY_CARE_PROVIDER_SITE_OTHER): Payer: Medicare Other | Admitting: Physical Therapy

## 2012-11-28 DIAGNOSIS — M545 Low back pain, unspecified: Secondary | ICD-10-CM

## 2012-11-28 DIAGNOSIS — M2560 Stiffness of unspecified joint, not elsewhere classified: Secondary | ICD-10-CM

## 2012-11-28 DIAGNOSIS — M6281 Muscle weakness (generalized): Secondary | ICD-10-CM

## 2012-12-02 ENCOUNTER — Encounter: Payer: Medicare Other | Admitting: Physical Therapy

## 2012-12-05 ENCOUNTER — Telehealth: Payer: Self-pay | Admitting: Family Medicine

## 2012-12-05 ENCOUNTER — Encounter (INDEPENDENT_AMBULATORY_CARE_PROVIDER_SITE_OTHER): Payer: Medicare Other | Admitting: Physical Therapy

## 2012-12-05 DIAGNOSIS — M545 Low back pain, unspecified: Secondary | ICD-10-CM

## 2012-12-05 DIAGNOSIS — M6281 Muscle weakness (generalized): Secondary | ICD-10-CM

## 2012-12-05 DIAGNOSIS — M2560 Stiffness of unspecified joint, not elsewhere classified: Secondary | ICD-10-CM

## 2012-12-05 NOTE — Telephone Encounter (Signed)
Called pt she tried PT for 2-3 wks.Katherine Cohen [place]

## 2012-12-05 NOTE — Telephone Encounter (Signed)
Images ordered. She can go anytime. How long did she try PT? 2 weeks?

## 2012-12-05 NOTE — Telephone Encounter (Signed)
Pt states PT did not work for her and wants an xray done of her back.

## 2012-12-06 ENCOUNTER — Ambulatory Visit (INDEPENDENT_AMBULATORY_CARE_PROVIDER_SITE_OTHER): Payer: Medicare Other

## 2012-12-06 DIAGNOSIS — M5137 Other intervertebral disc degeneration, lumbosacral region: Secondary | ICD-10-CM

## 2012-12-06 DIAGNOSIS — M4180 Other forms of scoliosis, site unspecified: Secondary | ICD-10-CM

## 2012-12-06 DIAGNOSIS — M545 Low back pain: Secondary | ICD-10-CM

## 2012-12-08 ENCOUNTER — Ambulatory Visit (INDEPENDENT_AMBULATORY_CARE_PROVIDER_SITE_OTHER): Payer: Medicare Other | Admitting: Sports Medicine

## 2012-12-08 VITALS — BP 120/70 | HR 61 | Wt 170.0 lb

## 2012-12-08 DIAGNOSIS — M545 Low back pain, unspecified: Secondary | ICD-10-CM

## 2012-12-08 MED ORDER — TRAMADOL HCL 50 MG PO TABS: ORAL_TABLET | ORAL | Status: DC

## 2012-12-08 MED ORDER — PREDNISONE 50 MG PO TABS: ORAL_TABLET | ORAL | Status: DC

## 2012-12-08 NOTE — Progress Notes (Signed)
   Subjective:    I'm seeing this patient as a consultation for:  Dr. Linford Arnold  CC: Low back pain  HPI: This is a pleasant 72 year old female with lumbar scoliosis and osteoarthritis of the knee who presents with many years of low back pain. The pain is associated with activity. It is bilateral, sharp and worse when she bends over or turns to the side. It improves with rest. It does not radiate. She is managing the pain with bengay spray. She takes diclofenac for her knee osteoarthritis but does not feel that it helps her back. She was recently referred to PT by her primary care physician but did not feel that she benefited from it.    Past medical history, Surgical history, Family history not pertinant except as noted below, Social history, Allergies, and medications have been entered into the medical record, reviewed, and no changes needed.   Review of Systems: No headache, visual changes, nausea, vomiting, diarrhea, constipation, dizziness, abdominal pain, skin rash, fevers, chills, night sweats, weight loss, swollen lymph nodes, body aches, joint swelling, muscle aches, chest pain, shortness of breath, mood changes, visual or auditory hallucinations.   Objective:   General: Well Developed, well nourished, and in no acute distress.  Neuro/Psych: Alert and oriented x3, extra-ocular muscles intact, able to move all 4 extremities, sensation grossly intact. Skin: Warm and dry, no rashes noted.  Respiratory: Not using accessory muscles, speaking in full sentences, trachea midline.  Cardiovascular: Pulses palpable, no extremity edema. Abdomen: Does not appear distended. Back Exam:  Inspection: Unremarkable  SLR laying: Negative  Palpable tenderness: Tender to palpation at the SI joints bilaterally. FABER: Positive. Sensory change: Gross sensation intact to all lumbar and sacral dermatomes.  Reflexes: 2+ at both patellar tendons, 2+ at achilles tendons, Babinski's downgoing.  Strength at foot    Plantar-flexion: 5/5 Dorsi-flexion: 5/5 Eversion: 5/5 Inversion: 5/5  Leg strength  Quad: 5/5 Hamstring: 5/5 Hip flexor: 5/5 Hip abductors: 5/5  Gait unremarkable.  X-RAY FINDINGS:  No fracture or spondylolisthesis is noted. Degenerative disc disease is noted at L1-2, L2-3 and L3-4.  Severe levoscoliosis of upper lumbar spine is noted.   Impression and Recommendations:   This case required medical decision making of moderate complexity.  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  Plan: 1. Tramadol as needed for back pain. 2. MRI to evaluate degenerative disc disease of lumbar spine. 3. Return for follow-up after MRI to discuss findings.  This note was originally written by Karin Lieu MS3

## 2012-12-08 NOTE — Assessment & Plan Note (Addendum)
This is multifactorial, though there is probably a slight contribution from her scoliosis, however most likely is related to degenerative discs, and likely left-sided SI joint dysfunction. We will start conservatively with prednisone, tramadol. I would also like to get an MRI. Like to see her back to go over results of the MRI.  If her pain looks to be more sacroiliac we can certainly try an injection of the guidance.

## 2012-12-13 ENCOUNTER — Ambulatory Visit (INDEPENDENT_AMBULATORY_CARE_PROVIDER_SITE_OTHER): Payer: Medicare Other

## 2012-12-13 DIAGNOSIS — M4180 Other forms of scoliosis, site unspecified: Secondary | ICD-10-CM

## 2012-12-13 DIAGNOSIS — M545 Low back pain: Secondary | ICD-10-CM

## 2012-12-13 DIAGNOSIS — M47817 Spondylosis without myelopathy or radiculopathy, lumbosacral region: Secondary | ICD-10-CM

## 2012-12-26 ENCOUNTER — Encounter: Payer: Self-pay | Admitting: Sports Medicine

## 2012-12-26 ENCOUNTER — Ambulatory Visit (INDEPENDENT_AMBULATORY_CARE_PROVIDER_SITE_OTHER): Payer: Medicare Other | Admitting: Sports Medicine

## 2012-12-26 VITALS — BP 148/72 | HR 61 | Wt 173.0 lb

## 2012-12-26 DIAGNOSIS — M545 Low back pain, unspecified: Secondary | ICD-10-CM

## 2012-12-26 MED ORDER — ACETAMINOPHEN ER 650 MG PO TBCR: 1300.0000 mg | EXTENDED_RELEASE_TABLET | Freq: Three times a day (TID) | ORAL | Status: DC | PRN

## 2012-12-26 NOTE — Progress Notes (Signed)
  Subjective:    CC: Followup back pain  HPI: This pleasant 72 year old female with fairly severe levoscoliosis and lumbar spondylosis comes in for followup of her MRI. So far none of the medications that she's taking have been helpful. Her pain is worst on the left side, and worse with twisting and extension. MRI results will be dictated below.  Past medical history, Surgical history, Family history not pertinant except as noted below, Social history, Allergies, and medications have been entered into the medical record, reviewed, and no changes needed.   Review of Systems: No fevers, chills, night sweats, weight loss, chest pain, or shortness of breath.   Objective:    General: Well Developed, well nourished, and in no acute distress.  Neuro: Alert and oriented x3, extra-ocular muscles intact, sensation grossly intact.  HEENT: Normocephalic, atraumatic, pupils equal round reactive to light, neck supple, no masses, no lymphadenopathy, thyroid nonpalpable.  Skin: Warm and dry, no rashes. Cardiac: Regular rate and rhythm, no murmurs rubs or gallops, no lower extremity edema.  Respiratory: Clear to auscultation bilaterally. Not using accessory muscles, speaking in full sentences.  There is a moderate to severe levoscoliosis, there were also multilevel facet spondylosis, worse at the L3-L4, L4-L5, and L5-S1 levels, is bilateral at the L5-S1 levels. She does have multilevel degenerative disc changes that are mild at the most.  Impression and Recommendations:     I spent 25 minutes with this patient, greater than 50% was face-to-face time counseling regarding lumbar spondylosis.

## 2012-12-26 NOTE — Assessment & Plan Note (Signed)
MRI was personally reviewed, there are very few and small disc protrusions, she does have fairly severe left-sided facet spondylosis, worse at the L5-S1, L4-L5, and L3-L4 levels. At this point I think we should proceed with left sided facet injections, 3 level as above. Like to see her back one week after the injections to see how things are going. 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  Tylenol arthritis in the meantime, she did not tolerate tramadol or prednisone.

## 2012-12-27 ENCOUNTER — Ambulatory Visit
Admission: RE | Admit: 2012-12-27 | Discharge: 2012-12-27 | Disposition: A | Discharge status: Home / Self Care | Payer: Medicare Other | Source: Ambulatory Visit | Attending: Sports Medicine | Admitting: Sports Medicine

## 2012-12-27 ENCOUNTER — Other Ambulatory Visit: Payer: Self-pay | Admitting: Sports Medicine

## 2012-12-27 DIAGNOSIS — M545 Low back pain: Secondary | ICD-10-CM

## 2012-12-27 MED ORDER — IOHEXOL 180 MG/ML  SOLN
1.0000 mL | Freq: Once | INTRAMUSCULAR | Status: AC | PRN
  Administered 2012-12-27: 1 mL via EPIDURAL

## 2012-12-27 MED ORDER — METHYLPREDNISOLONE ACETATE 40 MG/ML INJ SUSP (RADIOLOG
120.0000 mg | Freq: Once | INTRAMUSCULAR | Status: AC
  Administered 2012-12-27: 120 mg via EPIDURAL

## 2013-01-03 ENCOUNTER — Encounter: Payer: Self-pay | Admitting: Sports Medicine

## 2013-01-03 ENCOUNTER — Ambulatory Visit (INDEPENDENT_AMBULATORY_CARE_PROVIDER_SITE_OTHER): Payer: Medicare Other | Admitting: Sports Medicine

## 2013-01-03 VITALS — BP 127/75 | HR 88 | Wt 169.0 lb

## 2013-01-03 DIAGNOSIS — M545 Low back pain, unspecified: Secondary | ICD-10-CM

## 2013-01-03 NOTE — Progress Notes (Signed)
  Subjective:    CC: Followup  HPI: This is a very pleasant 72 year old female with fairly severe kyphoscoliosis, more recently we performed a left-sided L3-L4, L4-L5, and L5-S1 facet joint injection. She had 100% temporary pain relief for approximately 5 hours after the injection, afterwards pain only returned slightly, she tells me she is approximately 70-80% better now. Based on the interventional radiologists notes, she had the most concordant pain with the injection of the left L4-L5 facet joint. She still has occasional pain on the right side, and on my prior review of her MRI as she does have fairly severe facet spondylosis at the L5-S1 level. She does desire to proceed with further interventional treatment. Symptoms are mild, improving.  Past medical history, Surgical history, Family history not pertinant except as noted below, Social history, Allergies, and medications have been entered into the medical record, reviewed, and no changes needed.   Review of Systems: No fevers, chills, night sweats, weight loss, chest pain, or shortness of breath.   Objective:    General: Well Developed, well nourished, and in no acute distress.  Neuro: Alert and oriented x3, extra-ocular muscles intact, sensation grossly intact.  HEENT: Normocephalic, atraumatic, pupils equal round reactive to light, neck supple, no masses, no lymphadenopathy, thyroid nonpalpable.  Skin: Warm and dry, no rashes. Cardiac: Regular rate and rhythm, no murmurs rubs or gallops, no lower extremity edema.  Respiratory: Clear to auscultation bilaterally. Not using accessory muscles, speaking in full sentences.  On my review again of the MRI there is severe scoliosis with left-sided L3-L4, L4-L5, and L5-S1 facet arthritis, she also has severe right-sided L5-S1 facet arthritis.  Impression and Recommendations:

## 2013-01-03 NOTE — Assessment & Plan Note (Addendum)
Katherine Cohen tells me Katherine Cohen had 100% temporary, and 80% sustained relief to a left-sided L3-L4, L4-L5, and L5-S1 facet joint injections. Pain was most concordant over the left L4-L5 facet joint. At this point Katherine Cohen still does have a little pain on the right side, based on my prior review of her MRI Katherine Cohen does have severe facet spondylosis on the right at the L5-S1 level. Katherine Cohen does desire to proceed with a right-sided L5-S1 facet joint injection. Certainly if Katherine Cohen has some return of her pain we can proceed a medial branch blocks  and radiofrequency ablation.

## 2013-01-10 ENCOUNTER — Ambulatory Visit
Admission: RE | Admit: 2013-01-10 | Discharge: 2013-01-10 | Disposition: A | Discharge status: Home / Self Care | Payer: Medicare Other | Source: Ambulatory Visit | Attending: Sports Medicine | Admitting: Sports Medicine

## 2013-01-10 MED ORDER — IOHEXOL 180 MG/ML  SOLN
1.0000 mL | Freq: Once | INTRAMUSCULAR | Status: AC | PRN
  Administered 2013-01-10: 1 mL via INTRA_ARTICULAR

## 2013-01-10 MED ORDER — METHYLPREDNISOLONE ACETATE 40 MG/ML INJ SUSP (RADIOLOG
120.0000 mg | Freq: Once | INTRAMUSCULAR | Status: AC
  Administered 2013-01-10: 120 mg via INTRA_ARTICULAR

## 2013-01-12 ENCOUNTER — Other Ambulatory Visit: Payer: Self-pay | Admitting: *Deleted

## 2013-01-12 ENCOUNTER — Other Ambulatory Visit: Payer: Self-pay | Admitting: Family Medicine

## 2013-01-12 MED ORDER — METOPROLOL TARTRATE 50 MG PO TABS: 50.0000 mg | ORAL_TABLET | Freq: Two times a day (BID) | ORAL | Status: DC

## 2013-01-12 MED ORDER — DICLOFENAC SODIUM 75 MG PO TBEC: 75.0000 mg | DELAYED_RELEASE_TABLET | Freq: Every day | ORAL | Status: DC

## 2013-01-12 MED ORDER — LISINOPRIL-HYDROCHLOROTHIAZIDE 20-12.5 MG PO TABS: ORAL_TABLET | ORAL | Status: DC

## 2013-01-31 ENCOUNTER — Ambulatory Visit (INDEPENDENT_AMBULATORY_CARE_PROVIDER_SITE_OTHER): Payer: Medicare Other | Admitting: Family Medicine

## 2013-01-31 ENCOUNTER — Encounter: Payer: Self-pay | Admitting: Sports Medicine

## 2013-01-31 ENCOUNTER — Encounter: Payer: Self-pay | Admitting: Family Medicine

## 2013-01-31 ENCOUNTER — Ambulatory Visit (INDEPENDENT_AMBULATORY_CARE_PROVIDER_SITE_OTHER): Payer: Medicare Other | Admitting: Sports Medicine

## 2013-01-31 VITALS — BP 126/54 | HR 56 | Wt 170.0 lb

## 2013-01-31 VITALS — BP 126/54 | HR 56

## 2013-01-31 DIAGNOSIS — R059 Cough, unspecified: Secondary | ICD-10-CM

## 2013-01-31 DIAGNOSIS — J309 Allergic rhinitis, unspecified: Secondary | ICD-10-CM

## 2013-01-31 DIAGNOSIS — M47816 Spondylosis without myelopathy or radiculopathy, lumbar region: Secondary | ICD-10-CM

## 2013-01-31 DIAGNOSIS — Z8709 Personal history of other diseases of the respiratory system: Secondary | ICD-10-CM

## 2013-01-31 DIAGNOSIS — R05 Cough: Secondary | ICD-10-CM

## 2013-01-31 DIAGNOSIS — I10 Essential (primary) hypertension: Secondary | ICD-10-CM

## 2013-01-31 DIAGNOSIS — M47817 Spondylosis without myelopathy or radiculopathy, lumbosacral region: Secondary | ICD-10-CM

## 2013-01-31 MED ORDER — ACETAMINOPHEN ER 650 MG PO TBCR: 650.0000 mg | EXTENDED_RELEASE_TABLET | Freq: Three times a day (TID) | ORAL | Status: DC | PRN

## 2013-01-31 NOTE — Progress Notes (Signed)
  Subjective:    CC: Follow up  HPI: Lumbar spondylosis: This pleasant 72 year old female returns, she still has some pain relief from her left-sided L3-4, L4-5, and L5-S1 facet injections, pain was most concordant during the injection at the L4-L5 level on the left side.  Were recently we did obtain a right-sided L5-S1 facet injection, she did not have near the same relief but does have 50% pain relief after the right L5-S1 facet injection.  She is somewhat hesitant to proceed with medial branch blocks and radiofrequency ablation which  could provide her with pain relief for 9 months to a year. She is worried about being paralyzed.  At this point we are going to continue with conservative measures with diclofenac 75 mg twice a day, I have recommended adding continuous release Tylenol 3 times a day.  Past medical history, Surgical history, Family history not pertinant except as noted below, Social history, Allergies, and medications have been entered into the medical record, reviewed, and no changes needed.   Review of Systems: No fevers, chills, night sweats, weight loss, chest pain, or shortness of breath.   Objective:    General: Well Developed, well nourished, and in no acute distress.  Neuro: Alert and oriented x3, extra-ocular muscles intact, sensation grossly intact.  HEENT: Normocephalic, atraumatic, pupils equal round reactive to light, neck supple, no masses, no lymphadenopathy, thyroid nonpalpable.  Skin: Warm and dry, no rashes. Cardiac: Regular rate and rhythm, no murmurs rubs or gallops, no lower extremity edema.  Respiratory: Clear to auscultation bilaterally. Not using accessory muscles, speaking in full sentences.  Impression and Recommendations:

## 2013-01-31 NOTE — Progress Notes (Signed)
Subjective:    Patient ID: Katherine Cohen, female    DOB: Aug 02, 1940, 72 y.o.   MRN: [id number]  HPI Cough is constant with nasal drainage and sneezing and itching and runny nose.  No ST.  Cough is worse.  Post nasal drip is worse at nigth. She denies any wheezing but does report a childhood history of asthma. She reports that she outgrew it. No fevers chills or sweats. The cough and nasal drainage has been intermittent over this fall. She had similar symptoms back in the spring. She has not tried any over-the-counter antihistamines or other medications for her symptoms. She was concerned it was coming from her blood pressure pill.  HTN-  Pt denies chest pain, SOB, dizziness, or heart palpitations.  Taking meds as directed w/o problems.  C/O fatigue and cough.    Review of Systems  BP 126/54  Pulse 56    No Known Allergies  Past Medical History  Diagnosis Date  . OA (osteoarthritis) of knee   . Hypertension   . 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 GERD (gastroesophageal reflux disease)   . Edema     leg  . Hyperlipidemia   . Asthma     childhood asthma    Past Surgical History  Procedure Laterality Date  . Neck dissection      L  . Other surgical history      right ankle ulcerated wound I and D   . Hernia repair      left  inguinal hernia repair   . Cholecystectomy  05/22/2011    Procedure: LAPAROSCOPIC CHOLECYSTECTOMY WITH INTRAOPERATIVE CHOLANGIOGRAM;  Surgeon: Wilmon Arms. Corliss Skains, MD;  Location: [facility];  Service: General;  Laterality: N/A;  . Ventral hernia repair  05/22/2011    Procedure: HERNIA REPAIR VENTRAL ADULT;  Surgeon: Wilmon Arms. Corliss Skains, MD;  Location: [facility];  Service: General;  Laterality: N/A;    History   Social History  . Marital Status: Married    Spouse Name: N/A    Number of Children: N/A  . Years of Education: N/A   Occupational History  . Not on file.   Social History Main Topics  . Smoking status: Never Smoker   . Smokeless tobacco: Never  Used  . Alcohol Use: 4.2 oz/week    7 Glasses of wine per week     Comment: red wine   . Drug Use: No  . Sexual Activity:    Other Topics Concern  . Not on file   Social History Narrative  . No narrative on file    Family History  Problem Relation Age of Onset  . Stroke Mother   . Heart disease Father     AMI  . Lung cancer Brother     lung    Outpatient Encounter Prescriptions as of 01/31/2013  Medication Sig  . aspirin 81 MG tablet Take 81 mg by mouth daily.    . diclofenac (VOLTAREN) 75 MG EC tablet Take 1 tablet (75 mg total) by mouth daily.  Marland Kitchen lisinopril-hydrochlorothiazide (PRINZIDE,ZESTORETIC) 20-12.5 MG per tablet TAKE ONE TABLET BY MOUTH TWICE DAILY  . metoprolol (LOPRESSOR) 50 MG tablet Take 1 tablet (50 mg total) by mouth 2 (two) times daily.  Marland Kitchen omeprazole (PRILOSEC OTC) 20 MG tablet Take 1 tablet (20 mg total) by mouth daily.  . pravastatin (PRAVACHOL) 20 MG tablet Take 1 tablet (20 mg total) by mouth at bedtime.  . [DISCONTINUED] acetaminophen (TYLENOL) 650 MG CR  tablet Take 2 tablets (1,300 mg total) by mouth every 8 (eight) hours as needed for pain.          Objective:   Physical Exam  Constitutional: She is oriented to person, place, and time. She appears well-developed and well-nourished.  HENT:  Head: Normocephalic and atraumatic.  Right Ear: External ear normal.  Left Ear: External ear normal.  Nose: Nose normal.  Mouth/Throat: Oropharynx is clear and moist.  TMs and canals are clear.   Eyes: Conjunctivae and EOM are normal. Pupils are equal, round, and reactive to light.  Neck: Neck supple. No thyromegaly present.  Cardiovascular: Normal rate, regular rhythm and normal heart sounds.   Pulmonary/Chest: Effort normal and breath sounds normal. She has no wheezes.  Lymphadenopathy:    She has no cervical adenopathy.  Neurological: She is alert and oriented to person, place, and time.  Skin: Skin is warm and dry.  Psychiatric: She has a normal  mood and affect.          Assessment & Plan:  Allergic rhinitis - New Dx.  recommend start with over-the-counter Claritin once daily since she has not tried an antihistamine. She should know if it's helping within one to 2 weeks of taking it. If not then consider adding a nasal steroid spray or consider looking at her blood pressure regimen. The lisinopril certainly could be contributing to the cough, though it sounds like it's secondary to postnasal drip.  Hypertension-well-controlled on current regimen. Because of the fatigue she specifically asked if we could try Bystolic instead of metoprolol. I think that's reasonable but explained her that it would not be generic. Given samples of the thought that mg daily to try for one month. Followup in one month to recheck blood pressure and to see if she likes the medication. Can try to send it through the pharmacy and see if it's covered with her insurance at that point time.

## 2013-01-31 NOTE — Assessment & Plan Note (Addendum)
Catelin still has some pain relief from her left-sided L3-4, L4-5, and L5-S1 facet injections, pain was most concordant during the injection at the L4-L5 level on the left side. Were recently we did obtain a right-sided L5-S1 facet injection, she did not have near the same relief but does have 50% pain relief after this injection. She is somewhat hesitant to proceed with medial branch blocks and radiofrequency ablation which could provide her with pain relief for 9 months to a year. At this point we are going to continue with conservative measures with diclofenac 75 mg twice a day, I have recommended adding continuous release Tylenol 3 times a day. She is worried that if she proceeds with radiofrequency lesioning of the medial branch nerves she could end up with paralysis, I did describe in detail that the nerves involved were nowhere near her spinal cord. She can return to see me in 3 months.

## 2013-01-31 NOTE — Patient Instructions (Addendum)
Try the claritin once a day for 1-2 weeks and see if helps. If not then we can consider changing her medications. Try the bystolic once a day in place of the metoprolol

## 2013-02-20 ENCOUNTER — Other Ambulatory Visit: Payer: Self-pay | Admitting: Family Medicine

## 2013-04-03 ENCOUNTER — Other Ambulatory Visit: Payer: Self-pay | Admitting: Family Medicine

## 2013-04-17 ENCOUNTER — Other Ambulatory Visit: Payer: Self-pay | Admitting: Family Medicine

## 2013-05-09 ENCOUNTER — Other Ambulatory Visit: Payer: Self-pay | Admitting: Family Medicine

## 2013-06-08 ENCOUNTER — Other Ambulatory Visit: Payer: Self-pay | Admitting: Family Medicine

## 2013-07-18 ENCOUNTER — Other Ambulatory Visit: Payer: Self-pay | Admitting: Family Medicine

## 2013-07-18 ENCOUNTER — Ambulatory Visit: Payer: Medicare Other | Admitting: Family Medicine

## 2013-07-19 ENCOUNTER — Ambulatory Visit (INDEPENDENT_AMBULATORY_CARE_PROVIDER_SITE_OTHER): Payer: Medicare Other | Admitting: Family Medicine

## 2013-07-19 ENCOUNTER — Other Ambulatory Visit: Payer: Self-pay | Admitting: *Deleted

## 2013-07-19 ENCOUNTER — Encounter: Payer: Self-pay | Admitting: Family Medicine

## 2013-07-19 VITALS — BP 147/77 | HR 61 | Wt 175.0 lb

## 2013-07-19 DIAGNOSIS — M47816 Spondylosis without myelopathy or radiculopathy, lumbar region: Secondary | ICD-10-CM

## 2013-07-19 DIAGNOSIS — M47817 Spondylosis without myelopathy or radiculopathy, lumbosacral region: Secondary | ICD-10-CM

## 2013-07-19 DIAGNOSIS — I878 Other specified disorders of veins: Secondary | ICD-10-CM | POA: Insufficient documentation

## 2013-07-19 DIAGNOSIS — I10 Essential (primary) hypertension: Secondary | ICD-10-CM

## 2013-07-19 DIAGNOSIS — I872 Venous insufficiency (chronic) (peripheral): Secondary | ICD-10-CM

## 2013-07-19 MED ORDER — AMBULATORY NON FORMULARY MEDICATION: Status: DC

## 2013-07-19 MED ORDER — CHLORTHALIDONE 25 MG PO TABS: 25.0000 mg | ORAL_TABLET | Freq: Every day | ORAL | Status: DC

## 2013-07-19 MED ORDER — LISINOPRIL 40 MG PO TABS: 40.0000 mg | ORAL_TABLET | Freq: Every day | ORAL | Status: DC

## 2013-07-19 MED ORDER — DICLOFENAC SODIUM 75 MG PO TBEC: DELAYED_RELEASE_TABLET | ORAL | Status: DC

## 2013-07-19 MED ORDER — ACETAMINOPHEN ER 650 MG PO TBCR: 650.0000 mg | EXTENDED_RELEASE_TABLET | Freq: Three times a day (TID) | ORAL | Status: DC | PRN

## 2013-07-19 NOTE — Progress Notes (Signed)
Subjective:    Patient ID: Katherine Cohen, female    DOB: Apr 15, 1940, 73 y.o.   MRN: [id number]  HPI Hypertension- Pt denies chest pain, SOB, dizziness, or heart palpitations.  Taking meds as directed w/o problems.  Denies medication side effects.  Would like to change back to hr old diuretic as she felt it worked better for her lower extremity edema. She would like a new rx for compression stocking.    Venous stasis-she would like a new prescription for under stocking the Ambien grade compression stockings. She gets them through a company called [employer].  These were originally written by Dr. Sharol Given, her orthopedist.  She would also like to discuss her back pain. She has lumbar spondylosis and degenerative disc disease. She went to physical therapy but felt like it was not very helpful. She fell at most of it was stretching and not really core strengthening. She had spoken to Dr. Dianah Field about getting a back brace to help her when she does certain activities like making the bed and doing laundry. He had recommended a core strengthening first. She's also had injections and he has recommended ablation but she is not willing to do this at this time. She is asking for prescription for her back brace. Review of Systems  BP 147/77  Pulse 61  Wt 175 lb (79.379 kg)    No Known Allergies  Past Medical History  Diagnosis Date  . OA (osteoarthritis) of knee   . Hypertension   . 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 GERD (gastroesophageal reflux disease)   . Edema     leg  . Hyperlipidemia   . Asthma     childhood asthma    Past Surgical History  Procedure Laterality Date  . Neck dissection      L  . Other surgical history      right ankle ulcerated wound I and D   . Hernia repair      left  inguinal hernia repair   . Cholecystectomy  05/22/2011    Procedure: LAPAROSCOPIC CHOLECYSTECTOMY WITH INTRAOPERATIVE CHOLANGIOGRAM;  Surgeon: Imogene Burn. Georgette Dover, MD;  Location: [facility];  Service:  General;  Laterality: N/A;  . Ventral hernia repair  05/22/2011    Procedure: HERNIA REPAIR VENTRAL ADULT;  Surgeon: Imogene Burn. Georgette Dover, MD;  Location: [facility];  Service: General;  Laterality: N/A;    History   Social History  . Marital Status: Married    Spouse Name: N/A    Number of Children: N/A  . Years of Education: N/A   Occupational History  . Not on file.   Social History Main Topics  . Smoking status: Never Smoker   . Smokeless tobacco: Never Used  . Alcohol Use: 4.2 oz/week    7 Glasses of wine per week     Comment: red wine   . Drug Use: No  . Sexual Activity:    Other Topics Concern  . Not on file   Social History Narrative  . No narrative on file    Family History  Problem Relation Age of Onset  . Stroke Mother   . Heart disease Father     AMI  . Lung cancer Brother     lung    Outpatient Encounter Prescriptions as of 07/19/2013  Medication Sig  . acetaminophen (TYLENOL ARTHRITIS PAIN) 650 MG CR tablet Take 1 tablet (650 mg total) by mouth every 8 (eight) hours as needed for pain.  Marland Kitchen  AMBULATORY NON FORMULARY MEDICATION Medication Name: Understocking knee-high, size C-regular.  Medium grade (15-20 mmHg) compression stocking -knee high. 4 pairs.  Dx. Venous stasis.  Marland Kitchen aspirin 81 MG tablet Take 81 mg by mouth daily.    . chlorthalidone (HYGROTON) 25 MG tablet Take 1 tablet (25 mg total) by mouth daily.  . diclofenac (VOLTAREN) 75 MG EC tablet TAKE ONE TABLET BY MOUTH ONCE DAILY  . lisinopril (PRINIVIL,ZESTRIL) 40 MG tablet Take 1 tablet (40 mg total) by mouth daily.  . metoprolol (LOPRESSOR) 50 MG tablet TAKE ONE TABLET BY MOUTH TWICE DAILY -  NEEDS  OFFICE  VISIT  BEFORE  FURTHER  REFILLS  . omeprazole (PRILOSEC OTC) 20 MG tablet Take 1 tablet (20 mg total) by mouth daily.  . pravastatin (PRAVACHOL) 20 MG tablet TAKE ONE TABLET BY MOUTH AT BEDTIME  . [DISCONTINUED] acetaminophen (TYLENOL) 650 MG CR tablet Take 1 tablet (650 mg total) by mouth every 8 (eight)  hours as needed for pain.  . [DISCONTINUED] lisinopril-hydrochlorothiazide (PRINZIDE,ZESTORETIC) 20-12.5 MG per tablet TAKE ONE TABLET BY MOUTH TWICE DAILY -  NEEDS  OFFICE  VISIT  BEFORE  FURTHER  REFILLS          Objective:   Physical Exam  Constitutional: She is oriented to person, place, and time. She appears well-developed and well-nourished.  HENT:  Head: Normocephalic and atraumatic.  Cardiovascular: Normal rate, regular rhythm and normal heart sounds.   Pulmonary/Chest: Effort normal and breath sounds normal.  Musculoskeletal: She exhibits edema.  1+ pitting edema.   Neurological: She is alert and oriented to person, place, and time.  Skin: Skin is warm and dry.  Psychiatric: She has a normal mood and affect. Her behavior is normal.          Assessment & Plan:  HTN - well controlled on current regimen, but we'll switch back to chlorthalidone which she feels works better as the diuretic. Discontinue hydrochlorothiazide. Will change the lisinopril to 40 mg. She was previously taking 20 mg twice a day. I like to see her back in about 2 months just to make sure her blood pressures well regulated and we will check a BMP at that time to make sure electrolytes are okay since we are changing diuretics.  Venous stasis - new prescription written for compression stockings. She can get 4 pairs for $200.  Lumbar spondylosis-discussed options. I still explained to her that mainstay of therapy is core strengthening. She felt PT really didn't help.  I also discussed with her that Dr. Dianah Field felt like she would be a great candidate for radioablation said she had a great response to the injection. She still very fearful of any type of surgical procedure. Her that it's really not much more risky than the injection itself and she should really think about it and consider it as a real option. For now we'll go ahead and order a back brace. I gave her handout with some different options for a  lumbar sacral brace. She would choose which one she prefers and we will order for her and then call her when it arrives.

## 2013-08-01 ENCOUNTER — Other Ambulatory Visit: Payer: Self-pay | Admitting: Family Medicine

## 2013-08-02 ENCOUNTER — Other Ambulatory Visit: Payer: Self-pay | Admitting: *Deleted

## 2013-08-02 MED ORDER — METOPROLOL TARTRATE 50 MG PO TABS: ORAL_TABLET | ORAL | Status: DC

## 2013-09-08 ENCOUNTER — Telehealth: Payer: Self-pay | Admitting: *Deleted

## 2013-09-08 MED ORDER — PRAVASTATIN SODIUM 20 MG PO TABS: ORAL_TABLET | ORAL | Status: DC

## 2013-09-08 NOTE — Telephone Encounter (Signed)
Refill for pravastatin.Katherine Cohen [place]

## 2013-09-12 ENCOUNTER — Encounter: Payer: Self-pay | Admitting: Sports Medicine

## 2013-09-12 ENCOUNTER — Ambulatory Visit (INDEPENDENT_AMBULATORY_CARE_PROVIDER_SITE_OTHER): Payer: Medicare Other | Admitting: Sports Medicine

## 2013-09-12 VITALS — BP 127/72 | HR 52 | Wt 171.0 lb

## 2013-09-12 DIAGNOSIS — M47816 Spondylosis without myelopathy or radiculopathy, lumbar region: Secondary | ICD-10-CM

## 2013-09-12 DIAGNOSIS — M47817 Spondylosis without myelopathy or radiculopathy, lumbosacral region: Secondary | ICD-10-CM

## 2013-09-12 MED ORDER — TRAMADOL HCL 50 MG PO TABS: ORAL_TABLET | ORAL | Status: DC

## 2013-09-12 NOTE — Assessment & Plan Note (Signed)
Good temporary responses to left L4-5 facet and a right-sided L5-S1 facet injection. She continues to be apprehensive about proceeding to radiofrequency ablation. Going to increase to tramadol, she can also use her Tylenol, and I am going to order her a low profile light lumbar orthosis. We will contact the [employer] representative for fitting.

## 2013-09-12 NOTE — Progress Notes (Signed)
  Subjective:    CC: Recheck spine  HPI: This is a very pleasant 73 year old female, I have been treating her in the past, I haven't seen her for 7 months, she has lumbar spondylosis, and has been through several facet injections, we had the best response on the left at the L4-L5 facet with concordant pain during injection, and on the right at the L5-S1 level. We did recommend radiofrequency ablation but she was hesitant to proceed. She's been using arthritis strength Tylenol, 2 tabs at a time which takes the edge off the pain. It continues to be moderate, persistent. Physical therapy has been ineffective and she does desire a back brace.  Past medical history, Surgical history, Family history not pertinant except as noted below, Social history, Allergies, and medications have been entered into the medical record, reviewed, and no changes needed.   Review of Systems: No fevers, chills, night sweats, weight loss, chest pain, or shortness of breath.   Objective:    General: Well Developed, well nourished, and in no acute distress.  Neuro: Alert and oriented x3, extra-ocular muscles intact, sensation grossly intact.  HEENT: Normocephalic, atraumatic, pupils equal round reactive to light, neck supple, no masses, no lymphadenopathy, thyroid nonpalpable.  Skin: Warm and dry, no rashes. Cardiac: Regular rate and rhythm, no murmurs rubs or gallops, no lower extremity edema.  Respiratory: Clear to auscultation bilaterally. Not using accessory muscles, speaking in full sentences.  Impression and Recommendations:

## 2013-10-24 ENCOUNTER — Ambulatory Visit (INDEPENDENT_AMBULATORY_CARE_PROVIDER_SITE_OTHER): Payer: Medicare Other | Admitting: Family Medicine

## 2013-10-24 ENCOUNTER — Encounter: Payer: Self-pay | Admitting: Family Medicine

## 2013-10-24 VITALS — BP 121/67 | HR 67 | Temp 97.8°F | Wt 166.0 lb

## 2013-10-24 DIAGNOSIS — T63461A Toxic effect of venom of wasps, accidental (unintentional), initial encounter: Secondary | ICD-10-CM

## 2013-10-24 DIAGNOSIS — T6391XA Toxic effect of contact with unspecified venomous animal, accidental (unintentional), initial encounter: Secondary | ICD-10-CM

## 2013-10-24 MED ORDER — PREDNISONE 20 MG PO TABS: ORAL_TABLET | ORAL | Status: AC

## 2013-10-24 MED ORDER — METHYLPREDNISOLONE SODIUM SUCC 125 MG IJ SOLR
125.0000 mg | Freq: Once | INTRAMUSCULAR | Status: AC
  Administered 2013-10-24: 125 mg via INTRAMUSCULAR

## 2013-10-24 NOTE — Progress Notes (Signed)
CC: Katherine Cohen is a 73 y.o. female is here for bee sting on the hand   Subjective: HPI:  Complains of swelling and redness of the right hand involving the entire hand just proximal to the wrist. Symptoms came on abruptly after she was stung by a wasp at 10:00 today. She states it's overall painless however she's been using ice to keep the hands numb. She's never had this before. No interventions as of yet other than above and using cortisone cream which has not seemed to help much.  She states otherwise she is in her normal state of health. Denies fevers, chills, angioedema, difficulty breathing, wheezing, rapid heartbeat, nor diaphoresis    Review Of Systems Outlined In HPI  Past Medical History  Diagnosis Date  . OA (osteoarthritis) of knee   . Hypertension   . 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 GERD (gastroesophageal reflux disease)   . Edema     leg  . Hyperlipidemia   . Asthma     childhood asthma    Past Surgical History  Procedure Laterality Date  . Neck dissection      L  . Other surgical history      right ankle ulcerated wound I and D   . Hernia repair      left  inguinal hernia repair   . Cholecystectomy  05/22/2011    Procedure: LAPAROSCOPIC CHOLECYSTECTOMY WITH INTRAOPERATIVE CHOLANGIOGRAM;  Surgeon: Imogene Burn. Georgette Dover, MD;  Location: [facility];  Service: General;  Laterality: N/A;  . Ventral hernia repair  05/22/2011    Procedure: HERNIA REPAIR VENTRAL ADULT;  Surgeon: Imogene Burn. Georgette Dover, MD;  Location: [facility];  Service: General;  Laterality: N/A;   Family History  Problem Relation Age of Onset  . Stroke Mother   . Heart disease Father     AMI  . Lung cancer Brother     lung    History   Social History  . Marital Status: Married    Spouse Name: N/A    Number of Children: N/A  . Years of Education: N/A   Occupational History  . Not on file.   Social History Main Topics  . Smoking status: Never Smoker   . Smokeless tobacco: Never Used  . Alcohol  Use: 4.2 oz/week    7 Glasses of wine per week     Comment: red wine   . Drug Use: No  . Sexual Activity:    Other Topics Concern  . Not on file   Social History Narrative  . No narrative on file     Objective: BP 121/67  Pulse 67  Temp(Src) 97.8 F (36.6 C) (Oral)  Wt 166 lb (75.297 kg)  General: Alert and Oriented, No Acute Distress HEENT: Pupils equal, round, reactive to light. Conjunctivae clear.  Moist mucous membranes pharynx unremarkable no angioedema Lungs: Clear to auscultation bilaterally, no wheezing/ronchi/rales.  Comfortable work of breathing. Good air movement. Cardiac: Regular rate and rhythm. Normal S1/S2.  No murmurs, rubs, nor gallops.   Extremities: Moderate swelling of the entire right hand approximately 4 fingerbreadths back proximal from the wrist. This region is moderately erythematous but painless to the touch. Capillary refill time in all 5 fingers is less than one second..  Strong peripheral pulses.  Mental Status: No depression, anxiety, nor agitation. Skin: Warm and dry.  Assessment & Plan: [name] was seen today for bee sting on the hand.  Diagnoses and associated orders for this visit:  Wasp sting, accidental or unintentional, initial encounter - predniSONE (DELTASONE) 20 MG tablet; Three tabs at once daily for five days. - methylPREDNISolone sodium succinate (SOLU-MEDROL) 125 mg/2 mL injection 125 mg; Inject 2 mLs (125 mg total) into the muscle once.    She's given Solu-Medrol in the office today in hopes of slowing down the swelling in expansion, start prednisone as well. Consider Benadryl 50 mg to 3 times a day as needed for any swelling or discomfort.Signs and symptoms requring emergent/urgent reevaluation were discussed with the patient.  Return if symptoms worsen or fail to improve.

## 2013-11-06 ENCOUNTER — Other Ambulatory Visit: Payer: Self-pay | Admitting: Family Medicine

## 2013-12-08 ENCOUNTER — Other Ambulatory Visit: Payer: Self-pay | Admitting: Family Medicine

## 2014-01-17 ENCOUNTER — Other Ambulatory Visit: Payer: Self-pay | Admitting: Family Medicine

## 2014-02-02 ENCOUNTER — Other Ambulatory Visit: Payer: Self-pay | Admitting: Family Medicine

## 2014-03-08 ENCOUNTER — Other Ambulatory Visit: Payer: Self-pay | Admitting: Family Medicine

## 2014-03-22 ENCOUNTER — Encounter: Payer: Self-pay | Admitting: Family Medicine

## 2014-03-22 ENCOUNTER — Ambulatory Visit (INDEPENDENT_AMBULATORY_CARE_PROVIDER_SITE_OTHER): Payer: Medicare Other | Admitting: Family Medicine

## 2014-03-22 VITALS — BP 118/78 | HR 64 | Wt 173.0 lb

## 2014-03-22 DIAGNOSIS — Z23 Encounter for immunization: Secondary | ICD-10-CM | POA: Diagnosis not present

## 2014-03-22 DIAGNOSIS — I10 Essential (primary) hypertension: Secondary | ICD-10-CM | POA: Diagnosis not present

## 2014-03-22 DIAGNOSIS — E785 Hyperlipidemia, unspecified: Secondary | ICD-10-CM | POA: Diagnosis not present

## 2014-03-22 DIAGNOSIS — Z780 Asymptomatic menopausal state: Secondary | ICD-10-CM

## 2014-03-22 DIAGNOSIS — K219 Gastro-esophageal reflux disease without esophagitis: Secondary | ICD-10-CM | POA: Insufficient documentation

## 2014-03-22 MED ORDER — METOPROLOL TARTRATE 50 MG PO TABS: ORAL_TABLET | ORAL | Status: DC

## 2014-03-22 MED ORDER — PRAVASTATIN SODIUM 20 MG PO TABS: ORAL_TABLET | ORAL | Status: DC

## 2014-03-22 NOTE — Progress Notes (Signed)
   Subjective:    Patient ID: Katherine Cohen, female    DOB: Oct 03, 1940, 74 y.o.   MRN: [id number]  HPI Hypertension- Pt denies chest pain, SOB, dizziness, or heart palpitations.  Taking meds as directed w/o problems.  Denies medication side effects.  She is currently on chlorthalidone 25 mg, lisinopril 40 mg, metoprolol 50 mg twice a day.  Hyperlipidemia-doing well on pravastatin without any side effects or problems. When we last checked her cholesterol numbers 2 years ago it was recommended to increase her dose to 40 mg. She did not want to do so at that time. Next  Reflux- Takes it once a day.  Says if misses a day she gets sxs.   Review of Systems     Objective:   Physical Exam  Constitutional: She is oriented to person, place, and time. She appears well-developed and well-nourished.  HENT:  Head: Normocephalic and atraumatic.  Eyes: Conjunctivae and EOM are normal.  Neck: Neck supple. No thyromegaly present.  Cardiovascular: Normal rate, regular rhythm and normal heart sounds.   No carotid bruits.   Pulmonary/Chest: Effort normal and breath sounds normal.  Musculoskeletal:  No LE edema   Lymphadenopathy:    She has no cervical adenopathy.  Neurological: She is alert and oriented to person, place, and time.  Skin: Skin is warm and dry. No pallor.  Psychiatric: She has a normal mood and affect. Her behavior is normal.          Assessment & Plan:  Hyperlipidemia-due to recheck lipids and liver enzymes. Work on diet and exercise as she has been a little bit of weight since she retired.  GERD- okay to continue PPI but did warn her about the potential side effects such as increased vertebral fractures etc. I did recommend that she consider getting a bone density updated since her last one was 6 years ago.  DEXA ordered.   HTN - well controlled. Continue current regimen. Follow-up in 6 months.  Discussed need for Prevnar 13 vaccine. She's okay with getting that today.

## 2014-03-22 NOTE — Assessment & Plan Note (Signed)
Well-controlled.  Continue current regimen. 

## 2014-04-17 ENCOUNTER — Other Ambulatory Visit: Payer: Self-pay | Admitting: Family Medicine

## 2014-04-30 ENCOUNTER — Other Ambulatory Visit: Payer: Self-pay | Admitting: Family Medicine

## 2014-05-08 ENCOUNTER — Other Ambulatory Visit: Payer: Self-pay | Admitting: Family Medicine

## 2014-07-06 ENCOUNTER — Other Ambulatory Visit: Payer: Self-pay | Admitting: Family Medicine

## 2014-07-06 ENCOUNTER — Other Ambulatory Visit: Payer: Self-pay | Admitting: *Deleted

## 2014-07-06 NOTE — Telephone Encounter (Signed)
Pt called and stated that she will need refills of her medications. She wanted to inform Dr. Madilyn Fireman that her husband is undergoing Cancer tx and she has had to cancel her appts because of this and did not want her medications to be held due to this. I informed Dr. Madilyn Fireman of this and she is ok with refilling her meds. Pt advised that if there is anything that we can assist her with during this time to please call our office she will call for refills to be sent to her local pharmacy for now and will call to schedule a f/u appt soon as it is feasible for her to do so.Elouise Munroe'

## 2014-08-15 ENCOUNTER — Encounter: Payer: Self-pay | Admitting: Family Medicine

## 2014-08-15 ENCOUNTER — Ambulatory Visit (INDEPENDENT_AMBULATORY_CARE_PROVIDER_SITE_OTHER): Payer: Medicare Other | Admitting: Family Medicine

## 2014-08-15 VITALS — BP 142/84 | HR 50 | Wt 170.0 lb

## 2014-08-15 DIAGNOSIS — E785 Hyperlipidemia, unspecified: Secondary | ICD-10-CM | POA: Diagnosis not present

## 2014-08-15 DIAGNOSIS — M25561 Pain in right knee: Secondary | ICD-10-CM | POA: Diagnosis not present

## 2014-08-15 DIAGNOSIS — M25562 Pain in left knee: Secondary | ICD-10-CM

## 2014-08-15 DIAGNOSIS — M545 Low back pain: Secondary | ICD-10-CM

## 2014-08-15 DIAGNOSIS — I10 Essential (primary) hypertension: Secondary | ICD-10-CM

## 2014-08-15 DIAGNOSIS — M47816 Spondylosis without myelopathy or radiculopathy, lumbar region: Secondary | ICD-10-CM

## 2014-08-15 DIAGNOSIS — G8929 Other chronic pain: Secondary | ICD-10-CM

## 2014-08-15 MED ORDER — CHLORTHALIDONE 25 MG PO TABS: 25.0000 mg | ORAL_TABLET | Freq: Every day | ORAL | Status: DC

## 2014-08-15 MED ORDER — DULOXETINE HCL 30 MG PO CPEP: 30.0000 mg | ORAL_CAPSULE | Freq: Every day | ORAL | Status: DC

## 2014-08-15 MED ORDER — DICLOFENAC SODIUM 75 MG PO TBEC: 75.0000 mg | DELAYED_RELEASE_TABLET | Freq: Two times a day (BID) | ORAL | Status: DC | PRN

## 2014-08-15 NOTE — Progress Notes (Signed)
   Subjective:    Patient ID: Katherine Cohen, female    DOB: 23-Dec-1940, 74 y.o.   MRN: [id number]  HPI  Hypertension- Pt denies chest pain, SOB, dizziness, or heart palpitations.  Taking meds as directed w/o problems.  Denies medication side effects. She is currently on the lisinopril and metoprolol. She has been out of her chlorthalidone as well for one week.     Chronic low back pain - she wants to know she can start taking her diclofenac twice a day instead of once today. It does seem to help when she takes it but it's not getting her through the whole day. She's having a lot of stiffness and pain in her joints including her knees hips and especially hands. 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 She did have some facet joint injection done which were not helpful with Dr. Dianah Field. She is not interested in surgical intervention.   Review of Systems     Objective:   Physical Exam  Constitutional: She is oriented to person, place, and time. She appears well-developed and well-nourished.  HENT:  Head: Normocephalic and atraumatic.  Neck: Neck supple. No thyromegaly present.  Cardiovascular: Normal rate, regular rhythm and normal heart sounds.   Pulmonary/Chest: Effort normal and breath sounds normal.  Musculoskeletal: She exhibits edema.  1+ bilateral her chimney edema which is chronic  Lymphadenopathy:    She has no cervical adenopathy.  Neurological: She is alert and oriented to person, place, and time.  Skin: Skin is warm and dry.  Psychiatric: She has a normal mood and affect. Her behavior is normal.          Assessment & Plan:  HTN - uncontrolled but she's been on her chlorthalidone for the last couple of weeks. Unfortunately there was a miscommunication. Her medication was actually refilled in May and we did call [facility] to confirm that they did receive that prescription may. They said they do have it. She will restart this. Follow up in 6  weeks recheck blood pressure.     chronic low back pain/bilateral knee pain-discussed options. She is wanting to increase her diclofenac twice a day. I think twice a day occasionally is okay but not for chronic use. 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 We discussed Cymbalta as an option since it is now FDA approved for chronic musculoskeletal pain. She is willing to try it. She admits she's also been a little bit down as well and is hopeful it may lift her mood as well. Follow-up in 6 weeks.

## 2014-08-16 LAB — LIPID PANEL
Cholesterol: 208 mg/dL — ABNORMAL HIGH (ref 0–200)
HDL: 60 mg/dL (ref >=46)
LDL Cholesterol: 123 mg/dL — ABNORMAL HIGH (ref 0–99)
TRIGLYCERIDES: 124 mg/dL (ref <150)
Total CHOL/HDL Ratio: 3.5 Ratio
VLDL: 25 mg/dL (ref 0–40)

## 2014-08-16 LAB — COMPLETE METABOLIC PANEL WITH GFR
ALBUMIN: 4.3 g/dL (ref 3.5–5.2)
ALT: 10 U/L (ref 0–35)
AST: 16 U/L (ref 0–37)
Alkaline Phosphatase: 67 U/L (ref 39–117)
BUN: 20 mg/dL (ref 6–23)
CO2: 29 meq/L (ref 19–32)
Calcium: 9.5 mg/dL (ref 8.4–10.5)
Chloride: 101 mEq/L (ref 96–112)
Creat: 0.82 mg/dL (ref 0.50–1.10)
GFR, EST NON AFRICAN AMERICAN: 71 mL/min
GFR, Est African American: 82 mL/min
GLUCOSE: 99 mg/dL (ref 70–99)
Potassium: 3.9 mEq/L (ref 3.5–5.3)
Sodium: 139 mEq/L (ref 135–145)
Total Bilirubin: 0.6 mg/dL (ref 0.2–1.2)
Total Protein: 6.9 g/dL (ref 6.0–8.3)

## 2014-09-10 ENCOUNTER — Other Ambulatory Visit: Payer: Self-pay | Admitting: Family Medicine

## 2014-09-25 ENCOUNTER — Other Ambulatory Visit: Payer: Self-pay | Admitting: Family Medicine

## 2014-09-25 MED ORDER — CHLORTHALIDONE 25 MG PO TABS: 25.0000 mg | ORAL_TABLET | Freq: Every day | ORAL | Status: DC

## 2014-09-25 MED ORDER — PRAVASTATIN SODIUM 20 MG PO TABS: ORAL_TABLET | ORAL | Status: DC

## 2014-09-25 MED ORDER — LISINOPRIL 40 MG PO TABS: 40.0000 mg | ORAL_TABLET | Freq: Every day | ORAL | Status: DC

## 2014-09-25 MED ORDER — METOPROLOL TARTRATE 50 MG PO TABS: 50.0000 mg | ORAL_TABLET | Freq: Two times a day (BID) | ORAL | Status: DC

## 2014-09-26 ENCOUNTER — Encounter: Payer: Self-pay | Admitting: Family Medicine

## 2014-09-26 ENCOUNTER — Ambulatory Visit (INDEPENDENT_AMBULATORY_CARE_PROVIDER_SITE_OTHER): Payer: Medicare Other | Admitting: Family Medicine

## 2014-09-26 VITALS — BP 122/78 | HR 63 | Wt 168.0 lb

## 2014-09-26 DIAGNOSIS — I10 Essential (primary) hypertension: Secondary | ICD-10-CM

## 2014-09-26 DIAGNOSIS — M545 Low back pain, unspecified: Secondary | ICD-10-CM

## 2014-09-26 DIAGNOSIS — G8929 Other chronic pain: Secondary | ICD-10-CM

## 2014-09-26 NOTE — Progress Notes (Signed)
   Subjective:    Patient ID: Katherine Cohen, female    DOB: 1940/09/24, 74 y.o.   MRN: [id number]  HPI Follow-up chronic low back pain. I saw her about 8 weeks ago. We increased her diclofenac. She feels like it has been helping her pain. She gets a lot of stiffness in her joints particularly in the knees, hips, and hands. 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 She's had facet joint injections done which she felt were not helpful, with Dr. Dianah Field. And she is not interested in any surgical intervention at this time. We also discussed starting her on Cymbalta now that is FDA approved for chronic muscle musculoskeletal pain. She hasn't noticed any side effects.  she feels like it has improved her pain by about 75% and has also improved her mood. She feels like she has a little bit more energy.    hypertension-I last saw her she was out of her chlorthalidone so her blood pressure was mildly elevated.   Review of Systems     Objective:   Physical Exam  Constitutional: She is oriented to person, place, and time. She appears well-developed and well-nourished.  HENT:  Head: Normocephalic and atraumatic.  Cardiovascular: Normal rate, regular rhythm and normal heart sounds.   Pulmonary/Chest: Effort normal and breath sounds normal.  Neurological: She is alert and oriented to person, place, and time.  Skin: Skin is warm and dry.  Psychiatric: She has a normal mood and affect. Her behavior is normal.       Assessment & Plan:  Chronic low back pain - she is doing well on Cymbalta with no S.E.  She feel like her pain is 75% improved.  Continue current regimen at current dose. I think we'll stick with the 30 mg. Follow-up in 4 months.  HTN - well controlled.  blood pressure looks fantastic. Follow-up in 6 months.

## 2014-12-11 ENCOUNTER — Other Ambulatory Visit: Payer: Self-pay | Admitting: Family Medicine

## 2015-01-13 ENCOUNTER — Other Ambulatory Visit: Payer: Self-pay | Admitting: Family Medicine

## 2015-01-14 ENCOUNTER — Other Ambulatory Visit: Payer: Self-pay | Admitting: Family Medicine

## 2015-02-27 ENCOUNTER — Encounter: Payer: Self-pay | Admitting: Family Medicine

## 2015-02-27 ENCOUNTER — Ambulatory Visit (INDEPENDENT_AMBULATORY_CARE_PROVIDER_SITE_OTHER): Payer: Medicare Other | Admitting: Family Medicine

## 2015-02-27 VITALS — BP 125/67 | HR 65 | Wt 169.0 lb

## 2015-02-27 DIAGNOSIS — M47816 Spondylosis without myelopathy or radiculopathy, lumbar region: Secondary | ICD-10-CM

## 2015-02-27 DIAGNOSIS — R21 Rash and other nonspecific skin eruption: Secondary | ICD-10-CM | POA: Diagnosis not present

## 2015-02-27 DIAGNOSIS — E785 Hyperlipidemia, unspecified: Secondary | ICD-10-CM

## 2015-02-27 DIAGNOSIS — I10 Essential (primary) hypertension: Secondary | ICD-10-CM | POA: Diagnosis not present

## 2015-02-27 LAB — BASIC METABOLIC PANEL
BUN: 29 mg/dL — ABNORMAL HIGH (ref 7–25)
CHLORIDE: 101 mmol/L (ref 98–110)
CO2: 29 mmol/L (ref 20–31)
Calcium: 9.3 mg/dL (ref 8.6–10.4)
Creat: 1.02 mg/dL — ABNORMAL HIGH (ref 0.60–0.93)
Glucose, Bld: 102 mg/dL — ABNORMAL HIGH (ref 65–99)
POTASSIUM: 3.8 mmol/L (ref 3.5–5.3)
SODIUM: 139 mmol/L (ref 135–146)

## 2015-02-27 MED ORDER — AMBULATORY NON FORMULARY MEDICATION: Status: DC

## 2015-02-27 MED ORDER — PRAVASTATIN SODIUM 20 MG PO TABS: ORAL_TABLET | ORAL | Status: DC

## 2015-02-27 MED ORDER — METOPROLOL TARTRATE 50 MG PO TABS: ORAL_TABLET | ORAL | Status: DC

## 2015-02-27 MED ORDER — DICLOFENAC SODIUM 75 MG PO TBEC: 75.0000 mg | DELAYED_RELEASE_TABLET | Freq: Two times a day (BID) | ORAL | Status: DC | PRN

## 2015-02-27 MED ORDER — CHLORTHALIDONE 25 MG PO TABS: ORAL_TABLET | ORAL | Status: DC

## 2015-02-27 MED ORDER — DULOXETINE HCL 30 MG PO CPEP: ORAL_CAPSULE | ORAL | Status: DC

## 2015-02-27 MED ORDER — LISINOPRIL 40 MG PO TABS
ORAL_TABLET | ORAL | Status: DC
Start: 2015-02-27 — End: 2015-08-19

## 2015-02-27 NOTE — Progress Notes (Signed)
   Subjective:    Patient ID: Katherine Cohen, female    DOB: 04/21/1940, 75 y.o.   MRN: [id number]  HPI Hypertension- Pt denies chest pain, SOB, dizziness, or heart palpitations.  Taking meds as directed w/o problems.  Denies medication side effects.    Lumbar spondylosis -  Doing well with cymbalta and that has helped her pain. She takes in the Am. Then takes diclofenac at bedtime. This is been working well for her. She occasionally will take diclofenac twice in one day but very rarely. She has not had any abdominal pain or GI bleeding.    Hyperlipidemia - tolerating statin well without any side effects or myalgias.  Lab Results  Component Value Date   CHOL 208* 08/15/2014   HDL 60 08/15/2014   LDLCALC 123* 08/15/2014   TRIG 124 08/15/2014   CHOLHDL 3.5 08/15/2014   She also complains of a rash on her forearms. She said she noticed it after gardening this summer. She says initially it was a little worse and more intensely itchy. Now it looks a little bit better but just hasn't completely resolved. She initially thought it might be the gloves that she was using her contact exposure but has persisted. She denies any new lotion soaps detergents etc. and tries to use dye free perfume free products. It is primarily on her forearms. She says initially it was actually on her hands first. Right now her hands are clear of any rash or lesions. It's still mildly itchy. She's been mostly using Benadryl cream as needed.  Review of Systems     Objective:   Physical Exam  Constitutional: She is oriented to person, place, and time. She appears well-developed and well-nourished.  HENT:  Head: Normocephalic and atraumatic.  Cardiovascular: Normal rate, regular rhythm and normal heart sounds.   Pulmonary/Chest: Effort normal and breath sounds normal.  Neurological: She is alert and oriented to person, place, and time.  Skin: Skin is warm and dry.  Psychiatric: She has a normal mood and affect. Her  behavior is normal.          Assessment & Plan:  HTN - well controlled. Continue current regimen. Follow-up in 6 months.  Lumbar spondylosis-continue current dose of Cymbalta 30 mg. No adjustments made today. She is actually doing quite well. I refilled her to [facility] back as well. Follow-up in 6 months. Per minute her to stop the medication immediately if any GI upset or irritation.  Hyperlipidemia-doing well with the statin. Her LDL is 123 she would probably benefit from an increase on the medication.  Will discuss further at f/u visit.     rash, forearms-persistent for 5 months. Skin scraping done. KOH sent. Culture results once available. She will likely respond well to a topical steroid.

## 2015-02-28 LAB — KOH PREP: RESULT - KOH: NONE SEEN

## 2015-03-01 MED ORDER — TRIAMCINOLONE ACETONIDE 0.5 % EX OINT: 1.0000 "application " | TOPICAL_OINTMENT | Freq: Every day | CUTANEOUS | Status: DC | PRN

## 2015-03-01 NOTE — Addendum Note (Signed)
Addended by: Beatrice Lecher D on: 03/01/2015 01:11 PM   Modules accepted: Orders

## 2015-06-17 ENCOUNTER — Other Ambulatory Visit: Payer: Self-pay | Admitting: Family Medicine

## 2015-07-18 ENCOUNTER — Other Ambulatory Visit: Payer: Self-pay | Admitting: Family Medicine

## 2015-08-06 ENCOUNTER — Other Ambulatory Visit: Payer: Self-pay | Admitting: Family Medicine

## 2015-08-16 ENCOUNTER — Other Ambulatory Visit: Payer: Self-pay | Admitting: Family Medicine

## 2015-08-19 ENCOUNTER — Other Ambulatory Visit: Payer: Self-pay | Admitting: Family Medicine

## 2015-08-31 LAB — FECAL OCCULT BLOOD, GUAIAC: FECAL OCCULT BLD: NEGATIVE

## 2015-09-24 ENCOUNTER — Other Ambulatory Visit: Payer: Self-pay | Admitting: Family Medicine

## 2015-09-26 ENCOUNTER — Encounter: Payer: Self-pay | Admitting: Family Medicine

## 2015-11-25 DIAGNOSIS — L821 Other seborrheic keratosis: Secondary | ICD-10-CM | POA: Diagnosis not present

## 2015-11-25 DIAGNOSIS — Z08 Encounter for follow-up examination after completed treatment for malignant neoplasm: Secondary | ICD-10-CM | POA: Diagnosis not present

## 2015-11-25 DIAGNOSIS — Z85828 Personal history of other malignant neoplasm of skin: Secondary | ICD-10-CM | POA: Diagnosis not present

## 2015-11-25 DIAGNOSIS — L570 Actinic keratosis: Secondary | ICD-10-CM | POA: Diagnosis not present

## 2016-01-22 ENCOUNTER — Ambulatory Visit (INDEPENDENT_AMBULATORY_CARE_PROVIDER_SITE_OTHER): Payer: Medicare Other | Admitting: Family Medicine

## 2016-01-22 VITALS — BP 153/80 | HR 71 | Wt 170.0 lb

## 2016-01-22 DIAGNOSIS — F4320 Adjustment disorder, unspecified: Secondary | ICD-10-CM | POA: Diagnosis not present

## 2016-01-22 DIAGNOSIS — R7989 Other specified abnormal findings of blood chemistry: Secondary | ICD-10-CM | POA: Diagnosis not present

## 2016-01-22 DIAGNOSIS — M7989 Other specified soft tissue disorders: Secondary | ICD-10-CM | POA: Insufficient documentation

## 2016-01-22 DIAGNOSIS — G4700 Insomnia, unspecified: Secondary | ICD-10-CM | POA: Diagnosis not present

## 2016-01-22 DIAGNOSIS — F4321 Adjustment disorder with depressed mood: Secondary | ICD-10-CM

## 2016-01-22 MED ORDER — TRAZODONE HCL 50 MG PO TABS: 25.0000 mg | ORAL_TABLET | Freq: Every evening | ORAL | 0 refills | Status: DC | PRN

## 2016-01-22 MED ORDER — FUROSEMIDE 20 MG PO TABS: 20.0000 mg | ORAL_TABLET | Freq: Every day | ORAL | 0 refills | Status: DC

## 2016-01-22 NOTE — Progress Notes (Signed)
Katherine Cohen is a 75 y.o. female who presents to [facility]: [facility] today for swollen hands and feet. Patient has a 2 day history of swelling of her hands and feet bilaterally. This is mildly painful. She denies any fevers or chills or change in medication. Her husband has been very sick and dying from cancer recently died. The funeral was 3 days ago. She is obviously grieving and having trouble sleeping. She has been taking Advil PM and addition to her existing diclofenac prescription. Denies any changes in cosmetics perfumed soaps detergents or makeup.  She has not tried any treatment for her hands and feet symptom yet. She denies shortness of breath or trouble breathing.   Past Medical History:  Diagnosis Date  . Asthma    childhood asthma  . 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 Edema    leg  . GERD (gastroesophageal reflux disease)   . Hyperlipidemia   . Hypertension   . OA (osteoarthritis) of knee    Past Surgical History:  Procedure Laterality Date  . CHOLECYSTECTOMY  05/22/2011   Procedure: LAPAROSCOPIC CHOLECYSTECTOMY WITH INTRAOPERATIVE CHOLANGIOGRAM;  Surgeon: Imogene Burn. Georgette Dover, MD;  Location: [facility];  Service: General;  Laterality: N/A;  . HERNIA REPAIR     left  inguinal hernia repair   . NECK DISSECTION     L  . OTHER SURGICAL HISTORY     right ankle ulcerated wound I and D   . VENTRAL HERNIA REPAIR  05/22/2011   Procedure: HERNIA REPAIR VENTRAL ADULT;  Surgeon: Imogene Burn. Georgette Dover, MD;  Location: [facility];  Service: General;  Laterality: N/A;   Social History  Substance Use Topics  . Smoking status: Never Smoker  . Smokeless tobacco: Never Used  . Alcohol use 4.2 oz/week    7 Glasses of wine per week     Comment: red wine    family history includes Heart disease in her father; Lung cancer in her brother; Stroke in her mother.  ROS as  above:  Medications: Current Outpatient Prescriptions  Medication Sig Dispense Refill  . AMBULATORY NON FORMULARY MEDICATION Medication Name: Shingles vaccine IM x 1 1 vial 0  . chlorthalidone (HYGROTON) 25 MG tablet Take 1 tablet by mouth  daily 90 tablet 1  . diclofenac (VOLTAREN) 75 MG EC tablet Take 1 tablet by mouth two  times daily as needed 180 tablet 1  . furosemide (LASIX) 20 MG tablet Take 1 tablet (20 mg total) by mouth daily. 30 tablet 0  . ibuprofen (ADVIL,MOTRIN) 100 MG chewable tablet Chew 200 mg by mouth every 8 (eight) hours as needed.    Marland Kitchen lisinopril (PRINIVIL,ZESTRIL) 40 MG tablet Take 1 tablet by mouth  daily 90 tablet 1  . metoprolol (LOPRESSOR) 50 MG tablet Take 1 tablet by mouth two  times daily 180 tablet 1  . omeprazole (PRILOSEC OTC) 20 MG tablet Take 1 tablet (20 mg total) by mouth daily. 90 tablet 1  . pravastatin (PRAVACHOL) 20 MG tablet Take 1 tablet by mouth at  bedtime 90 tablet 1  . traZODone (DESYREL) 50 MG tablet Take 0.5-1 tablets (25-50 mg total) by mouth at bedtime as needed for sleep. 30 tablet 0  . triamcinolone ointment (KENALOG) 0.5 % Apply 1 application topically daily as needed. 45 g 0   No current facility-administered medications for this visit.    Allergies  Allergen Reactions  . Tramadol Nausea  Only    Health Maintenance Health Maintenance  Topic Date Due  . ZOSTAVAX  03/28/2000  . INFLUENZA VACCINE  09/24/2015  . COLONOSCOPY  04/29/2016  . COLON CANCER SCREENING ANNUAL FOBT  08/30/2016  . TETANUS/TDAP  08/03/2017  . DEXA SCAN  Completed  . PNA vac Low Risk Adult  Completed     Exam:  BP (!) 153/80   Pulse 71   Wt 170 lb (77.1 kg)   SpO2 100%   BMI 30.35 kg/m  Gen: Well NAD HEENT: EOMI,  MMM No significant JVD Lungs: Normal work of breathing. CTABL Heart: RRR no MRG Abd: NABS, Soft. Nondistended, Nontender Exts: Brisk capillary refill, warm and well perfused.  Hands and feet are mildly edematous bilaterally. Edema  extends to the distal forearm and to the proximal shins bilaterally. Pulses capillary refill and sensation are intact distally.   No results found for this or any previous visit (from the past 72 hour(s)). No results found.    Assessment and Plan: 75 y.o. female with  Edema. Unclear etiology. Possibly related to excessive use of NSAIDs. Recommend stopping Advil PM. Additionally we'll use small dose of Lasix and proceed with workup listed below. Recommend follow-up with PCP in the near future.  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 Recommend avoiding diphenhydramine over-the-counter. Will use trazodone temporarily. Follow-up with PCP.   Orders Placed This Encounter  Procedures  . CBC with Differential/Platelet  . COMPLETE METABOLIC PANEL WITH GFR  . B Nat Peptide    Discussed warning signs or symptoms. Please see discharge instructions. Patient expresses understanding.

## 2016-01-22 NOTE — Patient Instructions (Signed)
Thank you for coming in today. Take Furosamide daily as needed for swelling.  Get labs today.  Use trazodone as needed for insomnia.  Follow up with Dr Madilyn Fireman in a few weeks.   Furosemide tablets What is this medicine? FUROSEMIDE (fyoor [place] se mide) is a diuretic. It helps you make more urine and to lose salt and excess water from your body. This medicine is used to treat high blood pressure, and edema or swelling from heart, kidney, or liver disease. This medicine may be used for other purposes; ask your health care provider or pharmacist if you have questions. COMMON BRAND NAME(S): Active-Medicated Specimen Kit, Delone, Diuscreen, Lasix, RX Specimen Collection Kit, Specimen Collection Kit, URINX Medicated Specimen Collection What should I tell my health care provider before I take this medicine? They need to know if you have any of these conditions: -abnormal blood electrolytes -diarrhea or vomiting -gout -heart disease -kidney disease, small amounts of urine, or difficulty passing urine -liver disease -thyroid disease -an unusual or allergic reaction to furosemide, sulfa drugs, other medicines, foods, dyes, or preservatives -pregnant or trying to get pregnant -breast-feeding How should I use this medicine? Take this medicine by mouth with a glass of water. Follow the directions on the prescription label. You may take this medicine with or without food. If it upsets your stomach, take it with food or milk. Do not take your medicine more often than directed. Remember that you will need to pass more urine after taking this medicine. Do not take your medicine at a time of day that will cause you problems. Do not take at bedtime. Talk to your pediatrician regarding the use of this medicine in children. While this drug may be prescribed for selected conditions, precautions do apply. Overdosage: If you think you have taken too much of this medicine contact a poison control center or emergency  room at once. NOTE: This medicine is only for you. Do not share this medicine with others. What if I miss a dose? If you miss a dose, take it as soon as you can. If it is almost time for your next dose, take only that dose. Do not take double or extra doses. What may interact with this medicine? -aspirin and aspirin-like medicines -certain antibiotics -chloral hydrate -cisplatin -cyclosporine -digoxin -diuretics -laxatives -lithium -medicines for blood pressure -medicines that relax muscles for surgery -methotrexate -NSAIDs, medicines for pain and inflammation like ibuprofen, naproxen, or indomethacin -phenytoin -steroid medicines like prednisone or cortisone -sucralfate -thyroid hormones This list may not describe all possible interactions. Give your health care provider a list of all the medicines, herbs, non-prescription drugs, or dietary supplements you use. Also tell them if you smoke, drink alcohol, or use illegal drugs. Some items may interact with your medicine. What should I watch for while using this medicine? Visit your doctor or health care professional for regular checks on your progress. Check your blood pressure regularly. Ask your doctor or health care professional what your blood pressure should be, and when you should contact him or her. If you are a diabetic, check your blood sugar as directed. You may need to be on a special diet while taking this medicine. Check with your doctor. Also, ask how many glasses of fluid you need to drink a day. You must not get dehydrated. You may get drowsy or dizzy. Do not drive, use machinery, or do anything that needs mental alertness until you know how this drug affects you. Do not stand or sit  up quickly, especially if you are an older patient. This reduces the risk of dizzy or fainting spells. Alcohol can make you more drowsy and dizzy. Avoid alcoholic drinks. This medicine can make you more sensitive to the sun. Keep out of the sun.  If you cannot avoid being in the sun, wear protective clothing and use sunscreen. Do not use sun lamps or tanning beds/booths. What side effects may I notice from receiving this medicine? Side effects that you should report to your doctor or health care professional as soon as possible: -blood in urine or stools -dry mouth -fever or chills -hearing loss or ringing in the ears -irregular heartbeat -muscle pain or weakness, cramps -skin rash -stomach upset, pain, or nausea -tingling or numbness in the hands or feet -unusually weak or tired -vomiting or diarrhea -yellowing of the eyes or skin Side effects that usually do not require medical attention (report to your doctor or health care professional if they continue or are bothersome): -headache -loss of appetite -unusual bleeding or bruising This list may not describe all possible side effects. Call your doctor for medical advice about side effects. You may report side effects to FDA at 1-800-FDA-1088. Where should I keep my medicine? Keep out of the reach of children. Store at room temperature between 15 and 30 degrees C (59 and 86 degrees F). Protect from light. Throw away any unused medicine after the expiration date. NOTE: This sheet is a summary. It may not cover all possible information. If you have questions about this medicine, talk to your doctor, pharmacist, or health care provider.  2017 Elsevier/Gold Standard (2014-05-02 13:49:50)

## 2016-01-23 LAB — CBC WITH DIFFERENTIAL/PLATELET
BASOS ABS: 0 {cells}/uL (ref 0–200)
Basophils Relative: 0 %
EOS PCT: 1 %
Eosinophils Absolute: 123 cells/uL (ref 15–500)
HCT: 34 % — ABNORMAL LOW (ref 35.0–45.0)
HEMOGLOBIN: 10.3 g/dL — AB (ref 11.7–15.5)
LYMPHS PCT: 9 %
Lymphs Abs: 1107 cells/uL (ref 850–3900)
MCH: 26.3 pg — AB (ref 27.0–33.0)
MCHC: 30.3 g/dL — AB (ref 32.0–36.0)
MCV: 87 fL (ref 80.0–100.0)
MONOS PCT: 7 %
MPV: 9 fL (ref 7.5–12.5)
Monocytes Absolute: 861 cells/uL (ref 200–950)
NEUTROS PCT: 83 %
Neutro Abs: 10209 cells/uL — ABNORMAL HIGH (ref 1500–7800)
PLATELETS: 256 10*3/uL (ref 140–400)
RBC: 3.91 MIL/uL (ref 3.80–5.10)
RDW: 16.7 % — AB (ref 11.0–15.0)
WBC: 12.3 10*3/uL — AB (ref 3.8–10.8)

## 2016-01-23 LAB — COMPLETE METABOLIC PANEL WITH GFR
ALBUMIN: 3.8 g/dL (ref 3.6–5.1)
ALK PHOS: 77 U/L (ref 33–130)
ALT: 12 U/L (ref 6–29)
AST: 21 U/L (ref 10–35)
BUN: 45 mg/dL — ABNORMAL HIGH (ref 7–25)
CHLORIDE: 102 mmol/L (ref 98–110)
CO2: 26 mmol/L (ref 20–31)
Calcium: 9.1 mg/dL (ref 8.6–10.4)
Creat: 1.52 mg/dL — ABNORMAL HIGH (ref 0.60–0.93)
GFR, EST NON AFRICAN AMERICAN: 33 mL/min — AB (ref >=60)
GFR, Est African American: 38 mL/min — ABNORMAL LOW (ref >=60)
GLUCOSE: 103 mg/dL — AB (ref 65–99)
POTASSIUM: 4 mmol/L (ref 3.5–5.3)
SODIUM: 139 mmol/L (ref 135–146)
Total Bilirubin: 0.5 mg/dL (ref 0.2–1.2)
Total Protein: 6.6 g/dL (ref 6.1–8.1)

## 2016-01-24 LAB — BRAIN NATRIURETIC PEPTIDE: Brain Natriuretic Peptide: 216.7 pg/mL — ABNORMAL HIGH (ref <100)

## 2016-01-30 ENCOUNTER — Ambulatory Visit (INDEPENDENT_AMBULATORY_CARE_PROVIDER_SITE_OTHER): Payer: Medicare Other | Admitting: Family Medicine

## 2016-01-30 ENCOUNTER — Encounter: Payer: Self-pay | Admitting: Family Medicine

## 2016-01-30 VITALS — BP 139/63 | HR 57 | Ht 63.0 in | Wt 164.0 lb

## 2016-01-30 DIAGNOSIS — G5601 Carpal tunnel syndrome, right upper limb: Secondary | ICD-10-CM | POA: Diagnosis not present

## 2016-01-30 DIAGNOSIS — N179 Acute kidney failure, unspecified: Secondary | ICD-10-CM | POA: Diagnosis not present

## 2016-01-30 NOTE — Progress Notes (Signed)
Subjective:    CC: recent labs  HPI: 75 year old female with a history of hypertension came in about a week ago with bilateral hand swelling. She was seen by one of my partners. Decided to do additional lab workup and gave her Lasix to use as needed for the swelling. She did go for labs and there was a bump in her creatinine/renal function. Her creatinine went to 1.5 which was up from previous of 1.0 about one year ago. She reports that her swelling is a little better since taking the diuretic. No prior history of renal disorder or disease. 3 of kidney disease.  She does report that the hand swelling is much better. In fact it's almost completely gone in her hands and feet. But now she has some residual numbness in the tip of her thumb and the tip of her middle finger on her right hand. She said she was having numbness when she came in. None on the left though. No injury or trauma. No repetitive motion of the wrist. She said she's retired and hasn't worked in years.  Past medical history, Surgical history, Family history not pertinant except as noted below, Social history, Allergies, and medications have been entered into the medical record, reviewed, and corrections made.   Review of Systems: No fevers, chills, night sweats, weight loss, chest pain, or shortness of breath.   Objective:    General: Well Developed, well nourished, and in no acute distress.  Neuro: Alert and oriented x3, extra-ocular muscles intact, sensation grossly intact.  HEENT: Normocephalic, atraumatic  Skin: Warm and dry, no rashes. Cardiac: Regular rate and rhythm, no murmurs rubs or gallops, no lower extremity edema.  Respiratory: Clear to auscultation bilaterally. Not using accessory muscles, speaking in full sentences. Ext: Right wrist with normal range of motion. She does have what looks like a lipoma right over the anterior portion of the wrist. Positive Tennille's and positive Phalen sign. She says it increases the  numbness and tingling in her thumb and middle finger. Skin color is normal. Capillary refill less than 3 seconds.   Impression and Recommendations:    CKD 3-we'll perform urine microalbuminuria today as well as repeat creatinine and BUN. We'll schedule for renal ultrasound as well. Clearly there is been a significant jump in renal function over the last year some not sure if this is a more chronic trend or more acute trend. We will certainly try to figure this out.  Right carpal tunnel - This diagnosis of carpal tunnel syndrome. Information reviewed and handout provided with home stretches and exercises. Will provide her with a wrist cock-up splint to wear just at bedtime. Recommend that she not wear it during the day. She's not improving over 3-4 weeks and please let us know. Hold off on NSAIDs since her renal function is up. Can use Tylenol as needed for pain control.

## 2016-01-30 NOTE — Patient Instructions (Signed)
In use Tylenol as needed for pain control. Description wrist brace at night while sleeping. Can take off during the day. Numbness is not improving over the next 3 weeks and please let us know.

## 2016-01-31 LAB — MICROALBUMIN / CREATININE URINE RATIO

## 2016-01-31 LAB — BASIC METABOLIC PANEL WITH GFR
BUN: 40 mg/dL — AB (ref 7–25)
CHLORIDE: 99 mmol/L (ref 98–110)
CO2: 29 mmol/L (ref 20–31)
CREATININE: 1.65 mg/dL — AB (ref 0.60–0.93)
Calcium: 9.2 mg/dL (ref 8.6–10.4)
GFR, Est African American: 35 mL/min — ABNORMAL LOW (ref >=60)
GFR, Est Non African American: 30 mL/min — ABNORMAL LOW (ref >=60)
Glucose, Bld: 98 mg/dL (ref 65–99)
POTASSIUM: 3.8 mmol/L (ref 3.5–5.3)
Sodium: 139 mmol/L (ref 135–146)

## 2016-02-03 ENCOUNTER — Ambulatory Visit (INDEPENDENT_AMBULATORY_CARE_PROVIDER_SITE_OTHER): Payer: Medicare Other

## 2016-02-03 ENCOUNTER — Other Ambulatory Visit: Payer: Self-pay | Admitting: Family Medicine

## 2016-02-03 DIAGNOSIS — I129 Hypertensive chronic kidney disease with stage 1 through stage 4 chronic kidney disease, or unspecified chronic kidney disease: Secondary | ICD-10-CM | POA: Diagnosis not present

## 2016-02-03 DIAGNOSIS — N183 Chronic kidney disease, stage 3 (moderate): Secondary | ICD-10-CM

## 2016-02-03 DIAGNOSIS — N179 Acute kidney failure, unspecified: Secondary | ICD-10-CM | POA: Diagnosis not present

## 2016-02-04 LAB — MICROALBUMIN / CREATININE URINE RATIO
Creatinine, Urine: 139 mg/dL (ref 20–320)
MICROALB/CREAT RATIO: 8 ug/mg{creat} (ref <30)
Microalb, Ur: 1.1 mg/dL

## 2016-03-03 ENCOUNTER — Ambulatory Visit: Payer: Medicare Other | Admitting: Family Medicine

## 2016-03-12 ENCOUNTER — Other Ambulatory Visit: Payer: Self-pay | Admitting: Family Medicine

## 2016-03-13 ENCOUNTER — Other Ambulatory Visit: Payer: Self-pay | Admitting: *Deleted

## 2016-03-14 ENCOUNTER — Telehealth: Payer: Self-pay | Admitting: Family Medicine

## 2016-03-14 NOTE — Telephone Encounter (Signed)
LM for pt to sch AWV with Carla on 3/12 at 10:30, prior to F/U with Dr. Madilyn Fireman, [place]

## 2016-05-04 ENCOUNTER — Ambulatory Visit (INDEPENDENT_AMBULATORY_CARE_PROVIDER_SITE_OTHER): Payer: Medicare Other | Admitting: Family Medicine

## 2016-05-04 ENCOUNTER — Ambulatory Visit: Payer: Medicare Other

## 2016-05-04 ENCOUNTER — Encounter: Payer: Self-pay | Admitting: Family Medicine

## 2016-05-04 VITALS — BP 133/58 | HR 70 | Ht 63.0 in | Wt 167.0 lb

## 2016-05-04 DIAGNOSIS — N183 Chronic kidney disease, stage 3 unspecified: Secondary | ICD-10-CM

## 2016-05-04 DIAGNOSIS — I10 Essential (primary) hypertension: Secondary | ICD-10-CM | POA: Diagnosis not present

## 2016-05-04 DIAGNOSIS — E785 Hyperlipidemia, unspecified: Secondary | ICD-10-CM | POA: Diagnosis not present

## 2016-05-04 LAB — COMPLETE METABOLIC PANEL WITH GFR
ALT: 11 U/L (ref 6–29)
AST: 17 U/L (ref 10–35)
Albumin: 4.4 g/dL (ref 3.6–5.1)
Alkaline Phosphatase: 70 U/L (ref 33–130)
BUN: 28 mg/dL — AB (ref 7–25)
CALCIUM: 9.5 mg/dL (ref 8.6–10.4)
CO2: 33 mmol/L — ABNORMAL HIGH (ref 20–31)
Chloride: 101 mmol/L (ref 98–110)
Creat: 1.02 mg/dL — ABNORMAL HIGH (ref 0.60–0.93)
GFR, EST AFRICAN AMERICAN: 62 mL/min (ref >=60)
GFR, EST NON AFRICAN AMERICAN: 54 mL/min — AB (ref >=60)
Glucose, Bld: 123 mg/dL — ABNORMAL HIGH (ref 65–99)
POTASSIUM: 3.9 mmol/L (ref 3.5–5.3)
Sodium: 137 mmol/L (ref 135–146)
Total Bilirubin: 0.5 mg/dL (ref 0.2–1.2)
Total Protein: 7.2 g/dL (ref 6.1–8.1)

## 2016-05-04 LAB — LIPID PANEL W/REFLEX DIRECT LDL
CHOL/HDL RATIO: 3.5 ratio (ref <5.0)
CHOLESTEROL: 195 mg/dL (ref <200)
HDL: 56 mg/dL (ref >50)
LDL-Cholesterol: 115 mg/dL — ABNORMAL HIGH
Non-HDL Cholesterol (Calc): 139 mg/dL — ABNORMAL HIGH (ref <130)
TRIGLYCERIDES: 127 mg/dL (ref <150)

## 2016-05-04 MED ORDER — DICLOFENAC SODIUM 75 MG PO TBEC: DELAYED_RELEASE_TABLET | ORAL | 1 refills | Status: DC

## 2016-05-04 NOTE — Progress Notes (Signed)
Subjective:    CC: HTN  HPI: Hypertension- Pt denies chest pain, SOB, dizziness, or heart palpitations.  Taking meds as directed w/o problems.  Denies medication side effects.    CKD 3 - Due to recheck renal function. Last creatinine was 1.65. Renal US showed some medico-renal disease.    Hyperlipidemia - she is on pravastatin.  Tolerating well without any myalgias and side effects.    Past medical history, Surgical history, Family history not pertinant except as noted below, Social history, Allergies, and medications have been entered into the medical record, reviewed, and corrections made.   Review of Systems: No fevers, chills, night sweats, weight loss, chest pain, or shortness of breath.   Objective:    General: Well Developed, well nourished, and in no acute distress.  Neuro: Alert and oriented x3, extra-ocular muscles intact, sensation grossly intact.  HEENT: Normocephalic, atraumatic  Skin: Warm and dry, no rashes. Cardiac: Regular rate and rhythm, no murmurs rubs or gallops, no lower extremity edema.  Respiratory: Clear to auscultation bilaterally. Not using accessory muscles, speaking in full sentences.   Impression and Recommendations:    HTN  - Hypertension- Pt denies chest pain, SOB, dizziness, or heart palpitations.  Taking meds as directed w/o problems.  Denies medication side effects.    Hyperlpidemia - doing well on pravastatin.  Refilled for one year.    CKD 3 - overdue to recheck renal function.  Neg microalbumin.

## 2016-10-27 ENCOUNTER — Other Ambulatory Visit: Payer: Self-pay | Admitting: Family Medicine

## 2016-12-01 ENCOUNTER — Other Ambulatory Visit: Payer: Self-pay | Admitting: Family Medicine

## 2016-12-11 ENCOUNTER — Encounter: Payer: Self-pay | Admitting: Family Medicine

## 2016-12-11 ENCOUNTER — Ambulatory Visit (INDEPENDENT_AMBULATORY_CARE_PROVIDER_SITE_OTHER): Payer: Medicare Other | Admitting: Family Medicine

## 2016-12-11 VITALS — BP 138/72 | HR 61 | Ht 63.0 in | Wt 181.0 lb

## 2016-12-11 DIAGNOSIS — R635 Abnormal weight gain: Secondary | ICD-10-CM | POA: Diagnosis not present

## 2016-12-11 DIAGNOSIS — N183 Chronic kidney disease, stage 3 unspecified: Secondary | ICD-10-CM

## 2016-12-11 DIAGNOSIS — I10 Essential (primary) hypertension: Secondary | ICD-10-CM

## 2016-12-11 DIAGNOSIS — Z6832 Body mass index (BMI) 32.0-32.9, adult: Secondary | ICD-10-CM

## 2016-12-11 DIAGNOSIS — F4321 Adjustment disorder with depressed mood: Secondary | ICD-10-CM

## 2016-12-11 MED ORDER — PRAVASTATIN SODIUM 20 MG PO TABS: 20.0000 mg | ORAL_TABLET | Freq: Every day | ORAL | 3 refills | Status: DC

## 2016-12-11 MED ORDER — LISINOPRIL 40 MG PO TABS: 40.0000 mg | ORAL_TABLET | Freq: Every day | ORAL | 2 refills | Status: DC

## 2016-12-11 MED ORDER — CHLORTHALIDONE 25 MG PO TABS: 25.0000 mg | ORAL_TABLET | Freq: Every day | ORAL | 3 refills | Status: DC

## 2016-12-11 MED ORDER — METOPROLOL TARTRATE 50 MG PO TABS: 50.0000 mg | ORAL_TABLET | Freq: Two times a day (BID) | ORAL | 2 refills | Status: DC

## 2016-12-11 NOTE — Progress Notes (Signed)
Subjective:    CC: Blood pressure, recheck kidneys.  HPI:  Hypertension- Pt denies chest pain, SOB, dizziness, or heart palpitations.  Taking meds as directed w/o problems.  Denies medication side effects.    CKD 3 - No changes to renal functional medications.  She still grieving over the loss of her husband. He passed away last 2023-02-01 so it's almost been a year. She says her mood is just up and down. She has been emotionally eating. Her son actually will be moving back in with her soon which she seems positive about. He's behind a little bit on his finances and so wants to live with her for peritonsillar he can catch back up. She also has not been exercising and walking in her yard as she used to do.  Past medical history, Surgical history, Family history not pertinant except as noted below, Social history, Allergies, and medications have been entered into the medical record, reviewed, and corrections made.   Review of Systems: No fevers, chills, night sweats, weight loss, chest pain, or shortness of breath.   Objective:    General: Well Developed, well nourished, and in no acute distress.  Neuro: Alert and oriented x3, extra-ocular muscles intact, sensation grossly intact.  HEENT: Normocephalic, atraumatic  Skin: Warm and dry, no rashes. Cardiac: Regular rate and rhythm, no murmurs rubs or gallops, no lower extremity edema.  Respiratory: Clear to auscultation bilaterally. Not using accessory muscles, speaking in full sentences.   Impression and Recommendations:    HTN  - Well controlled. Continue current regimen. Follow up in  6 months.   CKD 3 - due to recheck renal function.   Abnormal weight gain-her weight is up about 14 pounds from when I saw her back in March. She lost her husband last year and admits that she's just been eating a lot of junk food and she has been as physically active. Previously she was walking around her apartment complex but there are some dogs nearby  that scare her and so she has not been getting out and doing her usual walk.  Grieving-overall doing well happy that her son is going to be moving in with her soon.

## 2016-12-12 LAB — MICROALBUMIN / CREATININE URINE RATIO
Creatinine, Urine: 66 mg/dL (ref 20–275)
Microalb Creat Ratio: 36 mcg/mg creat — ABNORMAL HIGH (ref <30)
Microalb, Ur: 2.4 mg/dL

## 2016-12-12 LAB — BASIC METABOLIC PANEL WITH GFR
BUN/Creatinine Ratio: 24 (calc) — ABNORMAL HIGH (ref 6–22)
BUN: 35 mg/dL — AB (ref 7–25)
CO2: 28 mmol/L (ref 20–32)
CREATININE: 1.45 mg/dL — AB (ref 0.60–0.93)
Calcium: 9.2 mg/dL (ref 8.6–10.4)
Chloride: 99 mmol/L (ref 98–110)
GFR, EST NON AFRICAN AMERICAN: 35 mL/min/{1.73_m2} — AB (ref >=60)
GFR, Est African American: 40 mL/min/{1.73_m2} — ABNORMAL LOW (ref >=60)
GLUCOSE: 87 mg/dL (ref 65–99)
Potassium: 4.7 mmol/L (ref 3.5–5.3)
Sodium: 137 mmol/L (ref 135–146)

## 2016-12-15 ENCOUNTER — Other Ambulatory Visit: Payer: Self-pay | Admitting: Family Medicine

## 2017-01-05 ENCOUNTER — Other Ambulatory Visit: Payer: Self-pay

## 2017-01-05 ENCOUNTER — Other Ambulatory Visit: Payer: Self-pay | Admitting: Family Medicine

## 2017-01-05 ENCOUNTER — Telehealth: Payer: Self-pay

## 2017-01-05 MED ORDER — OMEPRAZOLE 40 MG PO CPDR: 40.0000 mg | DELAYED_RELEASE_CAPSULE | Freq: Every day | ORAL | 3 refills | Status: DC

## 2017-01-05 NOTE — Telephone Encounter (Signed)
That is perfectly fine to send his prescription.  Okay for 90-day supply with 3 refills.

## 2017-01-05 NOTE — Telephone Encounter (Signed)
Patient would like to switch to omeprazole prescription so she doesn't have to pay the OTC cost of the medication. Please advise.

## 2017-01-06 NOTE — Telephone Encounter (Signed)
Medication sent.

## 2017-01-08 ENCOUNTER — Other Ambulatory Visit: Payer: Self-pay | Admitting: *Deleted

## 2017-01-08 MED ORDER — OMEPRAZOLE 40 MG PO CPDR: 40.0000 mg | DELAYED_RELEASE_CAPSULE | Freq: Every day | ORAL | 3 refills | Status: DC

## 2017-02-23 ENCOUNTER — Other Ambulatory Visit: Payer: Self-pay | Admitting: Family Medicine

## 2017-05-28 ENCOUNTER — Other Ambulatory Visit: Payer: Self-pay | Admitting: Family Medicine

## 2017-06-10 ENCOUNTER — Ambulatory Visit: Payer: Medicare Other | Admitting: Family Medicine

## 2017-06-10 DIAGNOSIS — Z0189 Encounter for other specified special examinations: Secondary | ICD-10-CM

## 2017-07-30 DIAGNOSIS — H353131 Nonexudative age-related macular degeneration, bilateral, early dry stage: Secondary | ICD-10-CM | POA: Diagnosis not present

## 2017-08-28 ENCOUNTER — Other Ambulatory Visit: Payer: Self-pay | Admitting: Family Medicine

## 2017-09-13 DIAGNOSIS — H25813 Combined forms of age-related cataract, bilateral: Secondary | ICD-10-CM | POA: Diagnosis not present

## 2017-09-13 DIAGNOSIS — H43812 Vitreous degeneration, left eye: Secondary | ICD-10-CM | POA: Diagnosis not present

## 2017-09-13 DIAGNOSIS — H1851 Endothelial corneal dystrophy: Secondary | ICD-10-CM | POA: Diagnosis not present

## 2017-09-13 DIAGNOSIS — H52223 Regular astigmatism, bilateral: Secondary | ICD-10-CM | POA: Diagnosis not present

## 2017-09-13 DIAGNOSIS — H353131 Nonexudative age-related macular degeneration, bilateral, early dry stage: Secondary | ICD-10-CM | POA: Diagnosis not present

## 2017-10-12 DIAGNOSIS — H25813 Combined forms of age-related cataract, bilateral: Secondary | ICD-10-CM | POA: Diagnosis not present

## 2017-10-12 DIAGNOSIS — H52223 Regular astigmatism, bilateral: Secondary | ICD-10-CM | POA: Diagnosis not present

## 2017-10-26 DIAGNOSIS — Z85828 Personal history of other malignant neoplasm of skin: Secondary | ICD-10-CM | POA: Diagnosis not present

## 2017-10-26 DIAGNOSIS — H25813 Combined forms of age-related cataract, bilateral: Secondary | ICD-10-CM | POA: Diagnosis not present

## 2017-10-26 DIAGNOSIS — K219 Gastro-esophageal reflux disease without esophagitis: Secondary | ICD-10-CM | POA: Diagnosis not present

## 2017-10-26 DIAGNOSIS — H25811 Combined forms of age-related cataract, right eye: Secondary | ICD-10-CM | POA: Diagnosis not present

## 2017-10-26 DIAGNOSIS — E785 Hyperlipidemia, unspecified: Secondary | ICD-10-CM | POA: Diagnosis not present

## 2017-10-26 DIAGNOSIS — M1990 Unspecified osteoarthritis, unspecified site: Secondary | ICD-10-CM | POA: Diagnosis not present

## 2017-10-26 DIAGNOSIS — Z79899 Other long term (current) drug therapy: Secondary | ICD-10-CM | POA: Diagnosis not present

## 2017-10-26 DIAGNOSIS — I10 Essential (primary) hypertension: Secondary | ICD-10-CM | POA: Diagnosis not present

## 2017-10-26 DIAGNOSIS — H52223 Regular astigmatism, bilateral: Secondary | ICD-10-CM | POA: Diagnosis not present

## 2017-10-27 DIAGNOSIS — Z961 Presence of intraocular lens: Secondary | ICD-10-CM | POA: Diagnosis not present

## 2017-10-27 DIAGNOSIS — H25812 Combined forms of age-related cataract, left eye: Secondary | ICD-10-CM | POA: Diagnosis not present

## 2017-10-27 DIAGNOSIS — H1851 Endothelial corneal dystrophy: Secondary | ICD-10-CM | POA: Diagnosis not present

## 2017-10-27 DIAGNOSIS — H52223 Regular astigmatism, bilateral: Secondary | ICD-10-CM | POA: Diagnosis not present

## 2017-10-27 DIAGNOSIS — H353131 Nonexudative age-related macular degeneration, bilateral, early dry stage: Secondary | ICD-10-CM | POA: Diagnosis not present

## 2017-11-02 DIAGNOSIS — H43812 Vitreous degeneration, left eye: Secondary | ICD-10-CM | POA: Diagnosis not present

## 2017-11-02 DIAGNOSIS — E785 Hyperlipidemia, unspecified: Secondary | ICD-10-CM | POA: Diagnosis not present

## 2017-11-02 DIAGNOSIS — I10 Essential (primary) hypertension: Secondary | ICD-10-CM | POA: Diagnosis not present

## 2017-11-02 DIAGNOSIS — H353130 Nonexudative age-related macular degeneration, bilateral, stage unspecified: Secondary | ICD-10-CM | POA: Diagnosis not present

## 2017-11-02 DIAGNOSIS — H25812 Combined forms of age-related cataract, left eye: Secondary | ICD-10-CM | POA: Diagnosis not present

## 2017-11-02 DIAGNOSIS — H353131 Nonexudative age-related macular degeneration, bilateral, early dry stage: Secondary | ICD-10-CM | POA: Diagnosis not present

## 2017-11-02 DIAGNOSIS — Z961 Presence of intraocular lens: Secondary | ICD-10-CM | POA: Diagnosis not present

## 2017-11-02 DIAGNOSIS — Z9841 Cataract extraction status, right eye: Secondary | ICD-10-CM | POA: Diagnosis not present

## 2017-11-02 DIAGNOSIS — H52223 Regular astigmatism, bilateral: Secondary | ICD-10-CM | POA: Diagnosis not present

## 2017-11-02 DIAGNOSIS — K219 Gastro-esophageal reflux disease without esophagitis: Secondary | ICD-10-CM | POA: Diagnosis not present

## 2017-11-02 DIAGNOSIS — H1851 Endothelial corneal dystrophy: Secondary | ICD-10-CM | POA: Diagnosis not present

## 2017-11-02 DIAGNOSIS — M1990 Unspecified osteoarthritis, unspecified site: Secondary | ICD-10-CM | POA: Diagnosis not present

## 2017-11-02 DIAGNOSIS — J45909 Unspecified asthma, uncomplicated: Secondary | ICD-10-CM | POA: Diagnosis not present

## 2017-12-01 ENCOUNTER — Other Ambulatory Visit: Payer: Self-pay | Admitting: Family Medicine

## 2018-03-27 ENCOUNTER — Other Ambulatory Visit: Payer: Self-pay | Admitting: Family Medicine

## 2018-03-28 NOTE — Telephone Encounter (Signed)
NEEDS LABS DONE

## 2018-04-12 ENCOUNTER — Ambulatory Visit (INDEPENDENT_AMBULATORY_CARE_PROVIDER_SITE_OTHER): Payer: Medicare Other | Admitting: Family Medicine

## 2018-04-12 ENCOUNTER — Encounter: Payer: Self-pay | Admitting: Family Medicine

## 2018-04-12 VITALS — BP 168/94 | HR 82 | Ht 63.0 in | Wt 165.0 lb

## 2018-04-12 DIAGNOSIS — I10 Essential (primary) hypertension: Secondary | ICD-10-CM | POA: Diagnosis not present

## 2018-04-12 DIAGNOSIS — K219 Gastro-esophageal reflux disease without esophagitis: Secondary | ICD-10-CM

## 2018-04-12 DIAGNOSIS — N183 Chronic kidney disease, stage 3 unspecified: Secondary | ICD-10-CM

## 2018-04-12 DIAGNOSIS — E785 Hyperlipidemia, unspecified: Secondary | ICD-10-CM

## 2018-04-12 NOTE — Progress Notes (Signed)
Subjective:    CC: BP and reflux.   HPI:  Hypertension- Pt denies chest pain, SOB, dizziness, or heart palpitations.  Taking meds as directed w/o problems.  Denies medication side effects.    F/U GERD -does take omeprazole 40 mg daily.  She feels like it overall works well to control her symptoms.  Hyperlipidemia-currently on pravastatin 20 mg daily.  Denies any myalgias or side effects.  For her arthritis she is currently taking diclofenac 75 mg twice a day.  She has been on it for a couple years but wants to know if she would be able to go up on her dose.  She wants to stay away from narcotic pain medications  Past medical history, Surgical history, Family history not pertinant except as noted below, Social history, Allergies, and medications have been entered into the medical record, reviewed, and corrections made.   Review of Systems: No fevers, chills, night sweats, weight loss, chest pain, or shortness of breath.   Objective:    General: Well Developed, well nourished, and in no acute distress.  Neuro: Alert and oriented x3, extra-ocular muscles intact, sensation grossly intact.  HEENT: Normocephalic, atraumatic  Skin: Warm and dry, no rashes. Cardiac: Regular rate and rhythm, no murmurs rubs or gallops, no lower extremity edema.  Respiratory: Clear to auscultation bilaterally. Not using accessory muscles, speaking in full sentences.   Impression and Recommendations:    HTN - uncontrolled. She will return in 2 weeks for nurse visit for recheck.    GERD - OK to continue PPI.    Hyperlipidemia- continue with statin.  Due to recheck lipids and liver.    Osteoarthritis.-she is already at the maximum dose of 150 mg daily of diclofenac.  We did discuss the option of generic Cymbalta which is been FDA approved for chronic musculoskeletal pain and studies reduce pain by about 30%.  We will continue with the turmeric.

## 2018-04-13 LAB — COMPLETE METABOLIC PANEL WITH GFR
AG Ratio: 1.6 (calc) (ref 1.0–2.5)
ALBUMIN MSPROF: 4.5 g/dL (ref 3.6–5.1)
ALKALINE PHOSPHATASE (APISO): 63 U/L (ref 37–153)
ALT: 11 U/L (ref 6–29)
AST: 17 U/L (ref 10–35)
BUN / CREAT RATIO: 20 (calc) (ref 6–22)
BUN: 29 mg/dL — AB (ref 7–25)
CO2: 31 mmol/L (ref 20–32)
CREATININE: 1.46 mg/dL — AB (ref 0.60–0.93)
Calcium: 9.6 mg/dL (ref 8.6–10.4)
Chloride: 96 mmol/L — ABNORMAL LOW (ref 98–110)
GFR, EST AFRICAN AMERICAN: 40 mL/min/{1.73_m2} — AB (ref >=60)
GFR, Est Non African American: 34 mL/min/{1.73_m2} — ABNORMAL LOW (ref >=60)
GLUCOSE: 95 mg/dL (ref 65–99)
Globulin: 2.8 g/dL (calc) (ref 1.9–3.7)
Potassium: 4.4 mmol/L (ref 3.5–5.3)
Sodium: 135 mmol/L (ref 135–146)
TOTAL PROTEIN: 7.3 g/dL (ref 6.1–8.1)
Total Bilirubin: 0.5 mg/dL (ref 0.2–1.2)

## 2018-04-13 LAB — LIPID PANEL W/REFLEX DIRECT LDL
CHOL/HDL RATIO: 3 (calc) (ref <5.0)
CHOLESTEROL: 170 mg/dL (ref <200)
HDL: 57 mg/dL (ref >=50)
LDL CHOLESTEROL (CALC): 90 mg/dL
Non-HDL Cholesterol (Calc): 113 mg/dL (calc) (ref <130)
TRIGLYCERIDES: 126 mg/dL (ref <150)

## 2018-06-08 ENCOUNTER — Other Ambulatory Visit: Payer: Self-pay | Admitting: Family Medicine

## 2018-07-11 ENCOUNTER — Other Ambulatory Visit: Payer: Self-pay

## 2018-07-11 NOTE — Patient Outreach (Signed)
[facility]) Care Management  07/11/2018  Katherine Cohen [birth year] [id number]   Medication Adherence call to Katherine Cohen Compliant Voice message left with a call back number. Katherine Cohen is showing past due on Pravastatin 20 mg and Lisinopril 40 mg under [place].   [place] [place] Management Direct Dial [phone]  Fax [phone] [email or web address]

## 2018-09-12 ENCOUNTER — Other Ambulatory Visit: Payer: Self-pay | Admitting: *Deleted

## 2018-09-12 MED ORDER — PRAVASTATIN SODIUM 20 MG PO TABS: 20.0000 mg | ORAL_TABLET | Freq: Every day | ORAL | 3 refills | Status: DC

## 2018-09-12 MED ORDER — CHLORTHALIDONE 25 MG PO TABS: 25.0000 mg | ORAL_TABLET | Freq: Every day | ORAL | 1 refills | Status: DC

## 2018-10-11 ENCOUNTER — Ambulatory Visit: Payer: Medicare Other | Admitting: Family Medicine

## 2018-12-01 ENCOUNTER — Other Ambulatory Visit: Payer: Self-pay | Admitting: Family Medicine

## 2019-01-03 ENCOUNTER — Encounter: Payer: Self-pay | Admitting: Family Medicine

## 2019-01-03 ENCOUNTER — Ambulatory Visit (INDEPENDENT_AMBULATORY_CARE_PROVIDER_SITE_OTHER): Payer: Medicare Other

## 2019-01-03 ENCOUNTER — Other Ambulatory Visit: Payer: Self-pay

## 2019-01-03 ENCOUNTER — Ambulatory Visit (INDEPENDENT_AMBULATORY_CARE_PROVIDER_SITE_OTHER): Payer: Medicare Other | Admitting: Family Medicine

## 2019-01-03 VITALS — BP 138/78 | HR 69 | Ht 63.0 in | Wt 158.0 lb

## 2019-01-03 DIAGNOSIS — K219 Gastro-esophageal reflux disease without esophagitis: Secondary | ICD-10-CM

## 2019-01-03 DIAGNOSIS — G8929 Other chronic pain: Secondary | ICD-10-CM

## 2019-01-03 DIAGNOSIS — M47818 Spondylosis without myelopathy or radiculopathy, sacral and sacrococcygeal region: Secondary | ICD-10-CM | POA: Diagnosis not present

## 2019-01-03 DIAGNOSIS — M545 Low back pain, unspecified: Secondary | ICD-10-CM | POA: Insufficient documentation

## 2019-01-03 DIAGNOSIS — I10 Essential (primary) hypertension: Secondary | ICD-10-CM | POA: Diagnosis not present

## 2019-01-03 DIAGNOSIS — M461 Sacroiliitis, not elsewhere classified: Secondary | ICD-10-CM

## 2019-01-03 MED ORDER — LISINOPRIL 40 MG PO TABS: 40.0000 mg | ORAL_TABLET | Freq: Every day | ORAL | 1 refills | Status: DC

## 2019-01-03 MED ORDER — METOPROLOL TARTRATE 50 MG PO TABS: 50.0000 mg | ORAL_TABLET | Freq: Two times a day (BID) | ORAL | 1 refills | Status: DC

## 2019-01-03 MED ORDER — DULOXETINE HCL 30 MG PO CPEP: 30.0000 mg | ORAL_CAPSULE | Freq: Every day | ORAL | 1 refills | Status: DC

## 2019-01-03 MED ORDER — OMEPRAZOLE 40 MG PO CPDR: 40.0000 mg | DELAYED_RELEASE_CAPSULE | Freq: Every day | ORAL | 3 refills | Status: DC

## 2019-01-03 NOTE — Patient Instructions (Addendum)
Okay to take 2 extra strength Tylenol up to 3 times a day which is the max.  But do starting with 2 in the morning and 2 in the evening would be helpful.  Were also going to try the duloxetine which is generic for Cymbalta daily.  We can always adjust her dose if you are doing well.  You may also benefit from an injection in the SI joint.  This can be done here in the office by Dr. Dianah Field if you would like to schedule that you certainly can.

## 2019-01-03 NOTE — Assessment & Plan Note (Signed)
Continue current regimen.  Refill sent to pharmacy.

## 2019-01-03 NOTE — Assessment & Plan Note (Signed)
Well controlled. Continue current regimen. Follow up in  6 mo  

## 2019-01-03 NOTE — Progress Notes (Signed)
Established Patient Office Visit  Subjective:  Patient ID: Katherine Cohen, female    DOB: [birth year]  Age: 78 y.o. MRN: [id number]  CC:  Chief Complaint  Patient presents with  . Hypertension  . Back Pain    HPI Katherine Cohen presents for  Hypertension- Pt denies chest pain, SOB, dizziness, or heart palpitations.  Taking meds as directed w/o problems.  Denies medication side effects.    F/U GERD - doing well on her medication.   She also c/o of low back pain that she rates out of a 10/10. She has been wearing a brace.  Ports that she is actually had back pain for years.  Been getting a little progressively worse.  In fact back in 2014 she saw Dr. Dianah Field and had facet injections done.  She says they really did not help and provide a lot of relief for her.  She really also wants to avoid opioids.  She says doing things that require any type of flexion of the low back is really bothersome such as mopping vacuuming or changing the bed sheets.  She denies any numbness or tingling going down into her legs.  She says her right low back feels worse than her left.  No fever, chills.  Past Medical History:  Diagnosis Date  . Asthma    childhood asthma  . 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 Edema    leg  . GERD (gastroesophageal reflux disease)   . Hyperlipidemia   . Hypertension   . OA (osteoarthritis) of knee     Past Surgical History:  Procedure Laterality Date  . CHOLECYSTECTOMY  05/22/2011   Procedure: LAPAROSCOPIC CHOLECYSTECTOMY WITH INTRAOPERATIVE CHOLANGIOGRAM;  Surgeon: Imogene Burn. Georgette Dover, MD;  Location: [facility];  Service: General;  Laterality: N/A;  . HERNIA REPAIR     left  inguinal hernia repair   . NECK DISSECTION     L  . OTHER SURGICAL HISTORY     right ankle ulcerated wound I and D   . VENTRAL HERNIA REPAIR  05/22/2011   Procedure: HERNIA REPAIR VENTRAL ADULT;  Surgeon: Imogene Burn. Georgette Dover, MD;  Location: [facility];  Service: General;  Laterality: N/A;     Family History  Problem Relation Age of Onset  . Stroke Mother   . Heart disease Father        AMI  . Lung cancer Brother        lung    Social History   Socioeconomic History  . Marital status: Married    Spouse name: Not on file  . Number of children: Not on file  . Years of education: Not on file  . Highest education level: Not on file  Occupational History  . Not on file  Social Needs  . Financial resource strain: Not on file  . Food insecurity    Worry: Not on file    Inability: Not on file  . Transportation needs    Medical: Not on file    Non-medical: Not on file  Tobacco Use  . Smoking status: Never Smoker  . Smokeless tobacco: Never Used  Substance and Sexual Activity  . Alcohol use: Yes    Alcohol/week: 7.0 standard drinks    Types: 7 Glasses of wine per week    Comment: red wine   . Drug use: No    Types: Hydromorphone  . Sexual activity: Not on file  Lifestyle  . Physical activity  Days per week: Not on file    Minutes per session: Not on file  . Stress: Not on file  Relationships  . Social Herbalist on phone: Not on file    Gets together: Not on file    Attends religious service: Not on file    Active member of club or organization: Not on file    Attends meetings of clubs or organizations: Not on file    Relationship status: Not on file  . Intimate partner violence    Fear of current or ex partner: Not on file    Emotionally abused: Not on file    Physically abused: Not on file    Forced sexual activity: Not on file  Other Topics Concern  . Not on file  Social History Narrative  . Not on file    Outpatient Medications Prior to Visit  Medication Sig Dispense Refill  . chlorthalidone (HYGROTON) 25 MG tablet Take 1 tablet (25 mg total) by mouth daily. 90 tablet 1  . diclofenac (VOLTAREN) 75 MG EC tablet TAKE 1 TABLET BY MOUTH  TWICE DAILY AS NEEDED 180 tablet 0  . pravastatin (PRAVACHOL) 20 MG tablet Take 1 tablet (20 mg  total) by mouth at bedtime. 90 tablet 3  . triamcinolone ointment (KENALOG) 0.5 % Apply 1 application topically daily as needed. 45 g 0  . Turmeric Curcumin 500 MG CAPS Take 500 mg by mouth 2 (two) times daily.    Marland Kitchen lisinopril (ZESTRIL) 40 MG tablet Take 1 tablet (40 mg total) by mouth daily. Due for follow up visit w/PCP 30 tablet 0  . metoprolol tartrate (LOPRESSOR) 50 MG tablet Take 1 tablet (50 mg total) by mouth 2 (two) times daily. Due for follow up visit w/PCP 60 tablet 0  . omeprazole (PRILOSEC) 40 MG capsule Take 1 capsule (40 mg total) by mouth daily. Due for follow up visit w/PCP 30 capsule 0   No facility-administered medications prior to visit.     Allergies  Allergen Reactions  . Tramadol Nausea Only    ROS Review of Systems    Objective:    Physical Exam  Constitutional: She is oriented to person, place, and time. She appears well-developed and well-nourished.  HENT:  Head: Normocephalic and atraumatic.  Cardiovascular: Normal rate, regular rhythm and normal heart sounds.  Pulmonary/Chest: Effort normal and breath sounds normal.  Musculoskeletal:     Comments: She was able to get up on the exam table.  Tender over the right SI joint.  Negative straight leg raise.  That she did have pain in her right low back with straight leg raise on the left.  Hip, knee, ankle strength is 5-5.  Patellar reflexes 1+ bilaterally.  Neurological: She is alert and oriented to person, place, and time.  Skin: Skin is warm and dry.  Psychiatric: She has a normal mood and affect. Her behavior is normal.    BP 138/78   Pulse 69   Ht 5\' 3"  (1.6 m)   Wt 158 lb (71.7 kg)   SpO2 96%   BMI 27.99 kg/m  Wt Readings from Last 3 Encounters:  01/03/19 158 lb (71.7 kg)  04/12/18 165 lb (74.8 kg)  12/11/16 181 lb (82.1 kg)     There are no preventive care reminders to display for this patient.  There are no preventive care reminders to display for this patient.  Lab Results  Component  Value Date   TSH 2.746 08/21/2009  Lab Results  Component Value Date   WBC 12.3 (H) 01/22/2016   HGB 10.3 (L) 01/22/2016   HCT 34.0 (L) 01/22/2016   MCV 87.0 01/22/2016   PLT 256 01/22/2016   Lab Results  Component Value Date   NA 135 04/12/2018   K 4.4 04/12/2018   CO2 31 04/12/2018   GLUCOSE 95 04/12/2018   BUN 29 (H) 04/12/2018   CREATININE 1.46 (H) 04/12/2018   BILITOT 0.5 04/12/2018   ALKPHOS 70 05/04/2016   AST 17 04/12/2018   ALT 11 04/12/2018   PROT 7.3 04/12/2018   ALBUMIN 4.4 05/04/2016   CALCIUM 9.6 04/12/2018   Lab Results  Component Value Date   CHOL 170 04/12/2018   Lab Results  Component Value Date   HDL 57 04/12/2018   Lab Results  Component Value Date   LDLCALC 90 04/12/2018   Lab Results  Component Value Date   TRIG 126 04/12/2018   Lab Results  Component Value Date   CHOLHDL 3.0 04/12/2018   No results found for: HGBA1C    Assessment & Plan:   Problem List Items Addressed This Visit      Cardiovascular and Mediastinum   ESSENTIAL HYPERTENSION, BENIGN - Primary    Well controlled. Continue current regimen. Follow up in  6 mo       Relevant Medications   lisinopril (ZESTRIL) 40 MG tablet   metoprolol tartrate (LOPRESSOR) 50 MG tablet   Other Relevant Orders   BASIC METABOLIC PANEL WITH GFR     Digestive   Gastroesophageal reflux disease without esophagitis    Continue current regimen.  Refill sent to pharmacy.      Relevant Medications   omeprazole (PRILOSEC) 40 MG capsule     Other   Chronic bilateral low back pain without sciatica   Relevant Orders   DG Lumbar Spine Complete    Other Visit Diagnoses    SI joint arthritis       Relevant Orders   DG Lumbar Spine Complete      Meds ordered this encounter  Medications  . lisinopril (ZESTRIL) 40 MG tablet    Sig: Take 1 tablet (40 mg total) by mouth daily.    Dispense:  90 tablet    Refill:  1    Requesting 1 year supply  . metoprolol tartrate (LOPRESSOR) 50  MG tablet    Sig: Take 1 tablet (50 mg total) by mouth 2 (two) times daily.    Dispense:  180 tablet    Refill:  1    Requesting 1 year supply  . omeprazole (PRILOSEC) 40 MG capsule    Sig: Take 1 capsule (40 mg total) by mouth daily.    Dispense:  90 capsule    Refill:  3    Requesting 1 year supply  . DULoxetine (CYMBALTA) 30 MG capsule    Sig: Take 1 capsule (30 mg total) by mouth daily.    Dispense:  30 capsule    Refill:  1    Follow-up: Return in about 6 weeks (around 02/14/2019) for new start Cymbalta.  Beatrice Lecher, MD

## 2019-01-04 LAB — BASIC METABOLIC PANEL WITH GFR
BUN/Creatinine Ratio: 23 (calc) — ABNORMAL HIGH (ref 6–22)
BUN: 34 mg/dL — ABNORMAL HIGH (ref 7–25)
CO2: 31 mmol/L (ref 20–32)
Calcium: 9.6 mg/dL (ref 8.6–10.4)
Chloride: 94 mmol/L — ABNORMAL LOW (ref 98–110)
Creat: 1.45 mg/dL — ABNORMAL HIGH (ref 0.60–0.93)
GFR, Est African American: 40 mL/min/{1.73_m2} — ABNORMAL LOW (ref >=60)
GFR, Est Non African American: 34 mL/min/{1.73_m2} — ABNORMAL LOW (ref >=60)
Glucose, Bld: 103 mg/dL — ABNORMAL HIGH (ref 65–99)
Potassium: 4.5 mmol/L (ref 3.5–5.3)
Sodium: 134 mmol/L — ABNORMAL LOW (ref 135–146)

## 2019-02-13 ENCOUNTER — Ambulatory Visit: Payer: Medicare Other | Admitting: Family Medicine

## 2019-04-05 ENCOUNTER — Other Ambulatory Visit: Payer: Self-pay | Admitting: Family Medicine

## 2019-05-01 DIAGNOSIS — H353131 Nonexudative age-related macular degeneration, bilateral, early dry stage: Secondary | ICD-10-CM | POA: Diagnosis not present

## 2019-06-05 ENCOUNTER — Other Ambulatory Visit: Payer: Self-pay | Admitting: Family Medicine

## 2019-08-14 ENCOUNTER — Encounter: Payer: Self-pay | Admitting: Family Medicine

## 2019-08-14 ENCOUNTER — Ambulatory Visit (INDEPENDENT_AMBULATORY_CARE_PROVIDER_SITE_OTHER): Payer: Medicare Other | Admitting: Family Medicine

## 2019-08-14 VITALS — BP 142/84 | HR 69 | Ht 63.0 in | Wt 153.0 lb

## 2019-08-14 DIAGNOSIS — M47816 Spondylosis without myelopathy or radiculopathy, lumbar region: Secondary | ICD-10-CM | POA: Diagnosis not present

## 2019-08-14 DIAGNOSIS — E785 Hyperlipidemia, unspecified: Secondary | ICD-10-CM

## 2019-08-14 DIAGNOSIS — M545 Low back pain, unspecified: Secondary | ICD-10-CM

## 2019-08-14 DIAGNOSIS — M47818 Spondylosis without myelopathy or radiculopathy, sacral and sacrococcygeal region: Secondary | ICD-10-CM

## 2019-08-14 DIAGNOSIS — I10 Essential (primary) hypertension: Secondary | ICD-10-CM | POA: Diagnosis not present

## 2019-08-14 DIAGNOSIS — G8929 Other chronic pain: Secondary | ICD-10-CM

## 2019-08-14 MED ORDER — DULOXETINE HCL 30 MG PO CPEP: 30.0000 mg | ORAL_CAPSULE | Freq: Every day | ORAL | 3 refills | Status: DC

## 2019-08-14 NOTE — Assessment & Plan Note (Addendum)
BP not at goal today, but it was improved after recheck.  Just encouraged her to continue to work on low-salt diet.  Plan to follow-up again in 6 months.  Would like for her to check her blood pressure at home occasionally if she is able to..  Look good the last time she was here.

## 2019-08-14 NOTE — Assessment & Plan Note (Signed)
Due to recheck lipids today.  She is currently on pravastatin 20 mg daily.

## 2019-08-14 NOTE — Assessment & Plan Note (Signed)
Not taking her Cymbalta yet daily.  Using it as needed.  We did discuss that it would be more effective if she took it daily but I understand she really wants to keep her medication regimen/pill burden to a minimum.  Refills sent to pharmacy today.

## 2019-08-14 NOTE — Progress Notes (Signed)
Established Patient Office Visit  Subjective:  Patient ID: Katherine Cohen, female    DOB: 03/25/1940  Age: 79 y.o. MRN: [id number]  CC:  Chief Complaint  Patient presents with  . Hypertension    HPI Katherine Cohen presents for   Hypertension- Pt denies chest pain, SOB, dizziness, or heart palpitations.  Taking meds as directed w/o problems.  Denies medication side effects.  She does follow a low-salt diet.  For her chronic bilateral low back pain and SI joints she is mostly been relying on Tylenol we had increased her dose to 3 times a day as needed and she says she is been using that once in a while she will take the Cymbalta but she is definitely not doing it daily.  She reports that she had injections done years ago when her husband was still living and says they only help for couple days so she is not interested in doing that again anytime soon.  She reports this past year has been difficult emotionally just feeling isolated from friends and family.  She has been very happy to recently go back to church again.  Hyperlipidemia - tolerating stating well with no myalgias or significant side effects.  Lab Results  Component Value Date   CHOL 170 04/12/2018   HDL 57 04/12/2018   LDLCALC 90 04/12/2018   TRIG 126 04/12/2018   CHOLHDL 3.0 04/12/2018      Past Medical History:  Diagnosis Date  . Asthma    childhood asthma  . Cancer ([place]) 1998   L neck , radiation  . Edema    leg  . GERD (gastroesophageal reflux disease)   . Hyperlipidemia   . Hypertension   . OA (osteoarthritis) of knee     Past Surgical History:  Procedure Laterality Date  . CHOLECYSTECTOMY  05/22/2011   Procedure: LAPAROSCOPIC CHOLECYSTECTOMY WITH INTRAOPERATIVE CHOLANGIOGRAM;  Surgeon: Imogene Burn. Georgette Dover, MD;  Location: [facility];  Service: General;  Laterality: N/A;  . HERNIA REPAIR     left  inguinal hernia repair   . NECK DISSECTION     L  . OTHER SURGICAL HISTORY     right ankle ulcerated wound I  and D   . VENTRAL HERNIA REPAIR  05/22/2011   Procedure: HERNIA REPAIR VENTRAL ADULT;  Surgeon: Imogene Burn. Georgette Dover, MD;  Location: [facility];  Service: General;  Laterality: N/A;    Family History  Problem Relation Age of Onset  . Stroke Mother   . Heart disease Father        AMI  . Lung cancer Brother        lung    Social History   Socioeconomic History  . Marital status: Married    Spouse name: Not on file  . Number of children: Not on file  . Years of education: Not on file  . Highest education level: Not on file  Occupational History  . Not on file  Tobacco Use  . Smoking status: Never Smoker  . Smokeless tobacco: Never Used  Substance and Sexual Activity  . Alcohol use: Yes    Alcohol/week: 7.0 standard drinks    Types: 7 Glasses of wine per week    Comment: red wine   . Drug use: No    Types: Hydromorphone  . Sexual activity: Not on file  Other Topics Concern  . Not on file  Social History Narrative  . Not on file   Social Determinants of Health   Financial  Resource Strain:   . Difficulty of Paying Living Expenses:   Food Insecurity:   . Worried About Charity fundraiser in the Last Year:   . Arboriculturist in the Last Year:   Transportation Needs:   . Film/video editor (Medical):   Marland Kitchen Lack of Transportation (Non-Medical):   Physical Activity:   . Days of Exercise per Week:   . Minutes of Exercise per Session:   Stress:   . Feeling of Stress :   Social Connections:   . Frequency of Communication with Friends and Family:   . Frequency of Social Gatherings with Friends and Family:   . Attends Religious Services:   . Active Member of Clubs or Organizations:   . Attends Archivist Meetings:   Marland Kitchen Marital Status:   Intimate Partner Violence:   . Fear of Current or Ex-Partner:   . Emotionally Abused:   Marland Kitchen Physically Abused:   . Sexually Abused:     Outpatient Medications Prior to Visit  Medication Sig Dispense Refill  . chlorthalidone  (HYGROTON) 25 MG tablet TAKE 1 TABLET BY MOUTH  DAILY 90 tablet 0  . diclofenac (VOLTAREN) 75 MG EC tablet TAKE 1 TABLET BY MOUTH  TWICE DAILY AS NEEDED 180 tablet 3  . lisinopril (ZESTRIL) 40 MG tablet TAKE 1 TABLET BY MOUTH  DAILY 90 tablet 0  . metoprolol tartrate (LOPRESSOR) 50 MG tablet TAKE 1 TABLET BY MOUTH  TWICE DAILY 180 tablet 0  . omeprazole (PRILOSEC) 40 MG capsule Take 1 capsule (40 mg total) by mouth daily. 90 capsule 3  . pravastatin (PRAVACHOL) 20 MG tablet Take 1 tablet (20 mg total) by mouth at bedtime. 90 tablet 3  . triamcinolone ointment (KENALOG) 0.5 % Apply 1 application topically daily as needed. 45 g 0  . Turmeric Curcumin 500 MG CAPS Take 500 mg by mouth 2 (two) times daily.    . DULoxetine (CYMBALTA) 30 MG capsule Take 1 capsule (30 mg total) by mouth daily. 30 capsule 1   No facility-administered medications prior to visit.    Allergies  Allergen Reactions  . Tramadol Nausea Only    ROS Review of Systems    Objective:    Physical Exam Constitutional:      Appearance: She is well-developed.  HENT:     Head: Normocephalic and atraumatic.  Cardiovascular:     Rate and Rhythm: Normal rate and regular rhythm.     Heart sounds: Normal heart sounds.  Pulmonary:     Effort: Pulmonary effort is normal.     Breath sounds: Normal breath sounds.  Skin:    General: Skin is warm and dry.     Comments: 2+ edema of the lower extremities she does have compression stockings on.  Neurological:     Mental Status: She is alert and oriented to person, place, and time.  Psychiatric:        Behavior: Behavior normal.     BP (!) 142/84   Pulse 69   Ht 5\' 3"  (1.6 m)   Wt 153 lb (69.4 kg)   SpO2 100%   BMI 27.10 kg/m  Wt Readings from Last 3 Encounters:  08/14/19 153 lb (69.4 kg)  01/03/19 158 lb (71.7 kg)  04/12/18 165 lb (74.8 kg)     Health Maintenance Due  Topic Date Due  . Hepatitis C Screening  Never done  . TETANUS/TDAP  08/03/2017    There  are no preventive care reminders to  display for this patient.  Lab Results  Component Value Date   TSH 2.746 08/21/2009   Lab Results  Component Value Date   WBC 12.3 (H) 01/22/2016   HGB 10.3 (L) 01/22/2016   HCT 34.0 (L) 01/22/2016   MCV 87.0 01/22/2016   PLT 256 01/22/2016   Lab Results  Component Value Date   NA 134 (L) 01/03/2019   K 4.5 01/03/2019   CO2 31 01/03/2019   GLUCOSE 103 (H) 01/03/2019   BUN 34 (H) 01/03/2019   CREATININE 1.45 (H) 01/03/2019   BILITOT 0.5 04/12/2018   ALKPHOS 70 05/04/2016   AST 17 04/12/2018   ALT 11 04/12/2018   PROT 7.3 04/12/2018   ALBUMIN 4.4 05/04/2016   CALCIUM 9.6 01/03/2019   Lab Results  Component Value Date   CHOL 170 04/12/2018   Lab Results  Component Value Date   HDL 57 04/12/2018   Lab Results  Component Value Date   LDLCALC 90 04/12/2018   Lab Results  Component Value Date   TRIG 126 04/12/2018   Lab Results  Component Value Date   CHOLHDL 3.0 04/12/2018   No results found for: HGBA1C    Assessment & Plan:   Problem List Items Addressed This Visit      Cardiovascular and Mediastinum   ESSENTIAL HYPERTENSION, BENIGN - Primary    BP not at goal today, but it was improved after recheck.  Just encouraged her to continue to work on low-salt diet.  Plan to follow-up again in 6 months.  Would like for her to check her blood pressure at home occasionally if she is able to..  Look good the last time she was here.      Relevant Orders   COMPLETE METABOLIC PANEL WITH GFR   Lipid panel     Musculoskeletal and Integument   Lumbar spondylosis     Other   Hyperlipidemia    Due to recheck lipids today.  She is currently on pravastatin 20 mg daily.      Relevant Orders   COMPLETE METABOLIC PANEL WITH GFR   Lipid panel   Chronic bilateral low back pain without sciatica    Not taking her Cymbalta yet daily.  Using it as needed.  We did discuss that it would be more effective if she took it daily but I  understand she really wants to keep her medication regimen/pill burden to a minimum.  Refills sent to pharmacy today.       Other Visit Diagnoses    SI joint arthritis          Meds ordered this encounter  Medications  . DULoxetine (CYMBALTA) 30 MG capsule    Sig: Take 1 capsule (30 mg total) by mouth daily.    Dispense:  90 capsule    Refill:  3    Follow-up: Return in about 6 months (around 02/13/2020) for Hypertension.    Beatrice Lecher, MD

## 2019-08-15 ENCOUNTER — Other Ambulatory Visit: Payer: Self-pay | Admitting: Family Medicine

## 2019-08-15 LAB — LIPID PANEL
Cholesterol: 149 mg/dL (ref <200)
HDL: 56 mg/dL (ref >=50)
LDL Cholesterol (Calc): 74 mg/dL (calc)
Non-HDL Cholesterol (Calc): 93 mg/dL (calc) (ref <130)
Total CHOL/HDL Ratio: 2.7 (calc) (ref <5.0)
Triglycerides: 103 mg/dL (ref <150)

## 2019-08-15 LAB — COMPLETE METABOLIC PANEL WITH GFR
AG Ratio: 1.7 (calc) (ref 1.0–2.5)
ALT: 8 U/L (ref 6–29)
AST: 14 U/L (ref 10–35)
Albumin: 4.3 g/dL (ref 3.6–5.1)
Alkaline phosphatase (APISO): 58 U/L (ref 37–153)
BUN/Creatinine Ratio: 25 (calc) — ABNORMAL HIGH (ref 6–22)
BUN: 29 mg/dL — ABNORMAL HIGH (ref 7–25)
CO2: 28 mmol/L (ref 20–32)
Calcium: 9.4 mg/dL (ref 8.6–10.4)
Chloride: 101 mmol/L (ref 98–110)
Creat: 1.17 mg/dL — ABNORMAL HIGH (ref 0.60–0.93)
GFR, Est African American: 51 mL/min/{1.73_m2} — ABNORMAL LOW (ref >=60)
GFR, Est Non African American: 44 mL/min/{1.73_m2} — ABNORMAL LOW (ref >=60)
Globulin: 2.6 g/dL (calc) (ref 1.9–3.7)
Glucose, Bld: 96 mg/dL (ref 65–99)
Potassium: 4.2 mmol/L (ref 3.5–5.3)
Sodium: 140 mmol/L (ref 135–146)
Total Bilirubin: 0.5 mg/dL (ref 0.2–1.2)
Total Protein: 6.9 g/dL (ref 6.1–8.1)

## 2019-08-20 ENCOUNTER — Other Ambulatory Visit: Payer: Self-pay | Admitting: Family Medicine

## 2019-08-21 ENCOUNTER — Other Ambulatory Visit: Payer: Self-pay | Admitting: Family Medicine

## 2019-08-31 ENCOUNTER — Other Ambulatory Visit: Payer: Self-pay | Admitting: Family Medicine

## 2019-09-05 ENCOUNTER — Other Ambulatory Visit: Payer: Self-pay | Admitting: Family Medicine

## 2019-09-21 ENCOUNTER — Encounter: Payer: Self-pay | Admitting: Family Medicine

## 2019-09-21 ENCOUNTER — Ambulatory Visit (INDEPENDENT_AMBULATORY_CARE_PROVIDER_SITE_OTHER): Payer: Medicare Other | Admitting: Family Medicine

## 2019-09-21 VITALS — BP 158/58 | HR 68 | Ht 63.0 in | Wt 153.0 lb

## 2019-09-21 DIAGNOSIS — R202 Paresthesia of skin: Secondary | ICD-10-CM

## 2019-09-21 DIAGNOSIS — M7021 Olecranon bursitis, right elbow: Secondary | ICD-10-CM | POA: Diagnosis not present

## 2019-09-21 NOTE — Progress Notes (Signed)
Acute Office Visit  Subjective:    Patient ID: Katherine Cohen, female    DOB: [birth year], 79 y.o.   MRN: [id number]  Chief Complaint  Patient presents with  . Mass    HPI Patient is in today for lump on her right elbow that has been there for months.  She said she really does not remember how long its been there.  It does not really bother her sometimes if she puts a lot of pressure on it is a lot it is a little uncomfortable she does remember any specific injury or trauma that may have caused it.  She is worried that it could be cancer or tumor.  She also reports numbness and tingling in her index middle and fourth finger on her right hand and wonders if the lump has started to cause problems.  The numbness and tingling has been maybe for about 3 weeks.  She says it does not ever wake her up at night but will notice that if she puts her hand underneath her pillow and falls asleep and actually is worse.  No loss of strength.  Also reports an itchy spot on the right side of her neck.  But has not noticed any rash etc.  Past Medical History:  Diagnosis Date  . Asthma    childhood asthma  . Cancer ([place]) 1998   L neck , radiation  . Edema    leg  . GERD (gastroesophageal reflux disease)   . Hyperlipidemia   . Hypertension   . OA (osteoarthritis) of knee     Past Surgical History:  Procedure Laterality Date  . CHOLECYSTECTOMY  05/22/2011   Procedure: LAPAROSCOPIC CHOLECYSTECTOMY WITH INTRAOPERATIVE CHOLANGIOGRAM;  Surgeon: Imogene Burn. Georgette Dover, MD;  Location: [facility];  Service: General;  Laterality: N/A;  . HERNIA REPAIR     left  inguinal hernia repair   . NECK DISSECTION     L  . OTHER SURGICAL HISTORY     right ankle ulcerated wound I and D   . VENTRAL HERNIA REPAIR  05/22/2011   Procedure: HERNIA REPAIR VENTRAL ADULT;  Surgeon: Imogene Burn. Georgette Dover, MD;  Location: [facility];  Service: General;  Laterality: N/A;    Family History  Problem Relation Age of Onset  . Stroke Mother   .  Heart disease Father        AMI  . Lung cancer Brother        lung    Social History   Socioeconomic History  . Marital status: Married    Spouse name: Not on file  . Number of children: Not on file  . Years of education: Not on file  . Highest education level: Not on file  Occupational History  . Not on file  Tobacco Use  . Smoking status: Never Smoker  . Smokeless tobacco: Never Used  Substance and Sexual Activity  . Alcohol use: Yes    Alcohol/week: 7.0 standard drinks    Types: 7 Glasses of wine per week    Comment: red wine   . Drug use: No    Types: Hydromorphone  . Sexual activity: Not on file  Other Topics Concern  . Not on file  Social History Narrative  . Not on file   Social Determinants of Health   Financial Resource Strain:   . Difficulty of Paying Living Expenses:   Food Insecurity:   . Worried About Charity fundraiser in the Last Year:   . Ran  Out of Food in the Last Year:   Transportation Needs:   . Lack of Transportation (Medical):   Marland Kitchen Lack of Transportation (Non-Medical):   Physical Activity:   . Days of Exercise per Week:   . Minutes of Exercise per Session:   Stress:   . Feeling of Stress :   Social Connections:   . Frequency of Communication with Friends and Family:   . Frequency of Social Gatherings with Friends and Family:   . Attends Religious Services:   . Active Member of Clubs or Organizations:   . Attends Archivist Meetings:   Marland Kitchen Marital Status:   Intimate Partner Violence:   . Fear of Current or Ex-Partner:   . Emotionally Abused:   Marland Kitchen Physically Abused:   . Sexually Abused:     Outpatient Medications Prior to Visit  Medication Sig Dispense Refill  . chlorthalidone (HYGROTON) 25 MG tablet TAKE 1 TABLET BY MOUTH  DAILY 90 tablet 3  . diclofenac (VOLTAREN) 75 MG EC tablet TAKE 1 TABLET BY MOUTH  TWICE DAILY AS NEEDED 180 tablet 3  . DULoxetine (CYMBALTA) 30 MG capsule Take 1 capsule (30 mg total) by mouth daily. 90  capsule 3  . lisinopril (ZESTRIL) 40 MG tablet TAKE 1 TABLET BY MOUTH  DAILY 90 tablet 3  . metoprolol tartrate (LOPRESSOR) 50 MG tablet TAKE 1 TABLET BY MOUTH  TWICE DAILY 180 tablet 3  . omeprazole (PRILOSEC) 40 MG capsule Take 1 capsule (40 mg total) by mouth daily. 90 capsule 3  . pravastatin (PRAVACHOL) 20 MG tablet TAKE 1 TABLET BY MOUTH AT  BEDTIME 90 tablet 3  . triamcinolone ointment (KENALOG) 0.5 % Apply 1 application topically daily as needed. 45 g 0  . Turmeric Curcumin 500 MG CAPS Take 500 mg by mouth 2 (two) times daily.     No facility-administered medications prior to visit.    Allergies  Allergen Reactions  . Tramadol Nausea Only    Review of Systems     Objective:    Physical Exam Vitals reviewed.  Constitutional:      Appearance: She is well-developed.  HENT:     Head: Normocephalic and atraumatic.  Eyes:     Conjunctiva/sclera: Conjunctivae normal.  Cardiovascular:     Rate and Rhythm: Normal rate.     Comments: Right radial pulse 2+. Pulmonary:     Effort: Pulmonary effort is normal.  Musculoskeletal:     Comments: Does have a soft but mobile bursa at the olecranon.  Nontender on exam.  No surrounding erythema or induration.  No rash over the surface of the skin.  Right wrist with normal range of motion.  Fingers with normal range of motion.  Skin:    General: Skin is dry.     Coloration: Skin is not pale.  Neurological:     Mental Status: She is alert and oriented to person, place, and time.  Psychiatric:        Behavior: Behavior normal.     BP (!) 158/58   Pulse 68   Ht 5\' 3"  (1.6 m)   Wt 153 lb (69.4 kg)   SpO2 99%   BMI 27.10 kg/m  Wt Readings from Last 3 Encounters:  09/21/19 153 lb (69.4 kg)  08/14/19 153 lb (69.4 kg)  01/03/19 158 lb (71.7 kg)    Health Maintenance Due  Topic Date Due  . Hepatitis C Screening  Never done  . TETANUS/TDAP  08/03/2017    There are  no preventive care reminders to display for this  patient.   Lab Results  Component Value Date   TSH 2.746 08/21/2009   Lab Results  Component Value Date   WBC 12.3 (H) 01/22/2016   HGB 10.3 (L) 01/22/2016   HCT 34.0 (L) 01/22/2016   MCV 87.0 01/22/2016   PLT 256 01/22/2016   Lab Results  Component Value Date   NA 140 08/14/2019   K 4.2 08/14/2019   CO2 28 08/14/2019   GLUCOSE 96 08/14/2019   BUN 29 (H) 08/14/2019   CREATININE 1.17 (H) 08/14/2019   BILITOT 0.5 08/14/2019   ALKPHOS 70 05/04/2016   AST 14 08/14/2019   ALT 8 08/14/2019   PROT 6.9 08/14/2019   ALBUMIN 4.4 05/04/2016   CALCIUM 9.4 08/14/2019   Lab Results  Component Value Date   CHOL 149 08/14/2019   Lab Results  Component Value Date   HDL 56 08/14/2019   Lab Results  Component Value Date   LDLCALC 74 08/14/2019   Lab Results  Component Value Date   TRIG 103 08/14/2019   Lab Results  Component Value Date   CHOLHDL 2.7 08/14/2019   No results found for: HGBA1C     Assessment & Plan:   Problem List Items Addressed This Visit    None    Visit Diagnoses    Paresthesia of finger    -  Primary   Olecranon bursitis of right elbow         Olecranon bursitis of the right elbow -no significant induration or inflammation to suggest underlying infection.  She is really not tender on exam today so do not think it is an acutely inflamed.  Discussed trying to avoid putting pressure on that area.  Reassured her that I do not think that this is a cancerous mass.  Paresthesias of the fingers on the right hand-suspect carpal tunnel syndrome but did discuss that there are other differentials.  She actually already has a cock up splint at home so recommended that she start wearing that at night for the next 3 weeks if it is improving then continue a full treatment for 6 weeks and if it is not improving after 3 weeks please let us know and we will refer her to sports medicine or Ortho for further work-up.  Also complained of an itchy area on the right side  of her neck but the skin looks completely normal there is no rash or abnormal moles etc.  I do not see a cause for the itching no dry skin etc.  We will keep an eye on this as well.   No orders of the defined types were placed in this encounter.    Beatrice Lecher, MD

## 2019-09-21 NOTE — Patient Instructions (Signed)
Wear your wrist splint at night while you are sleeping for the next 3 weeks.  If you feel like it is improving the numbness and tingling in your fingers then continue it for 6 months of treatment.  If after 3 weeks you feel like it is not helpful then please give [place] a call back and we will get you in with one of our sports medicine providers.

## 2019-11-29 ENCOUNTER — Other Ambulatory Visit: Payer: Self-pay | Admitting: Family Medicine

## 2020-02-21 IMAGING — DX DG LUMBAR SPINE COMPLETE 4+V
5 series · 5 of 5 positions shown · non-contrast
Comparison: 12/06/2012

CLINICAL DATA: Low back pain

EXAM:
LUMBAR SPINE - COMPLETE 4+ VIEW

[l-spine ap]
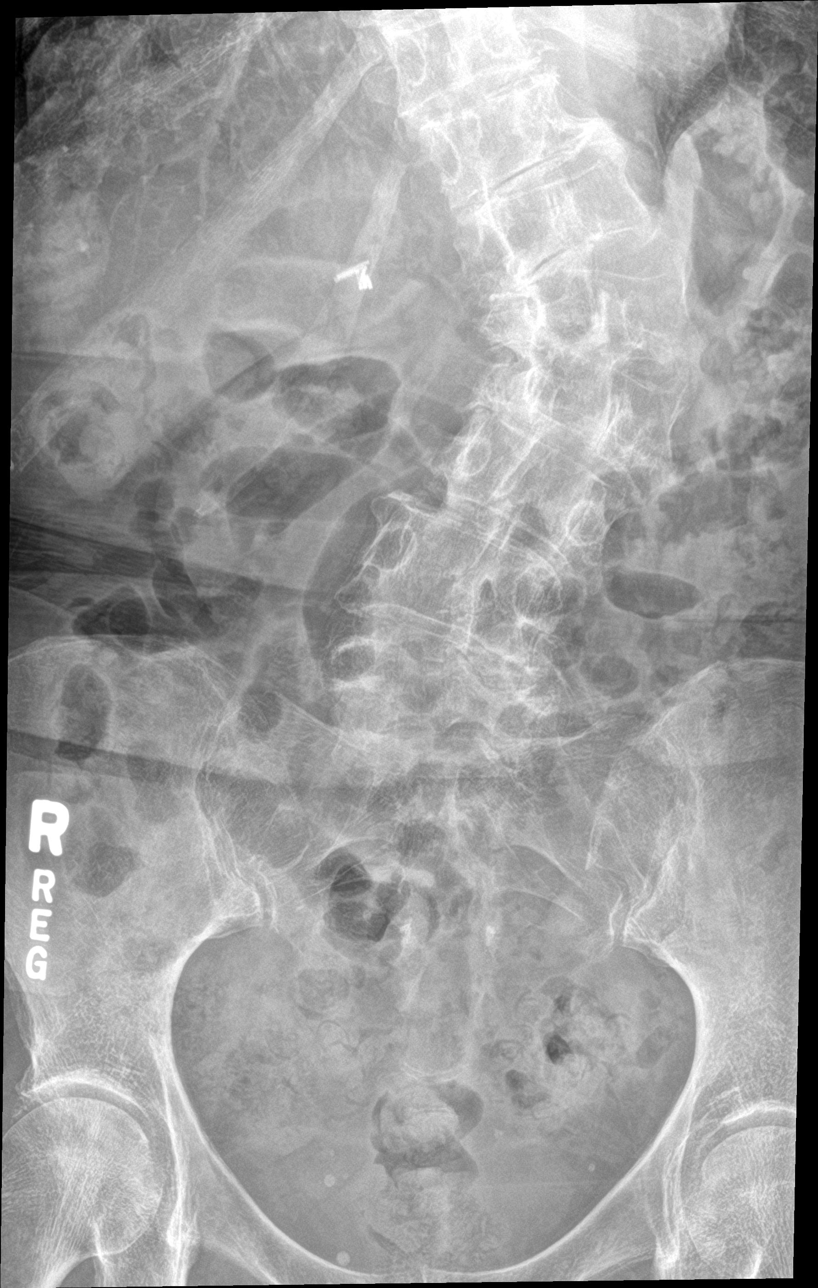

[l-spine obl (1 of 2)]
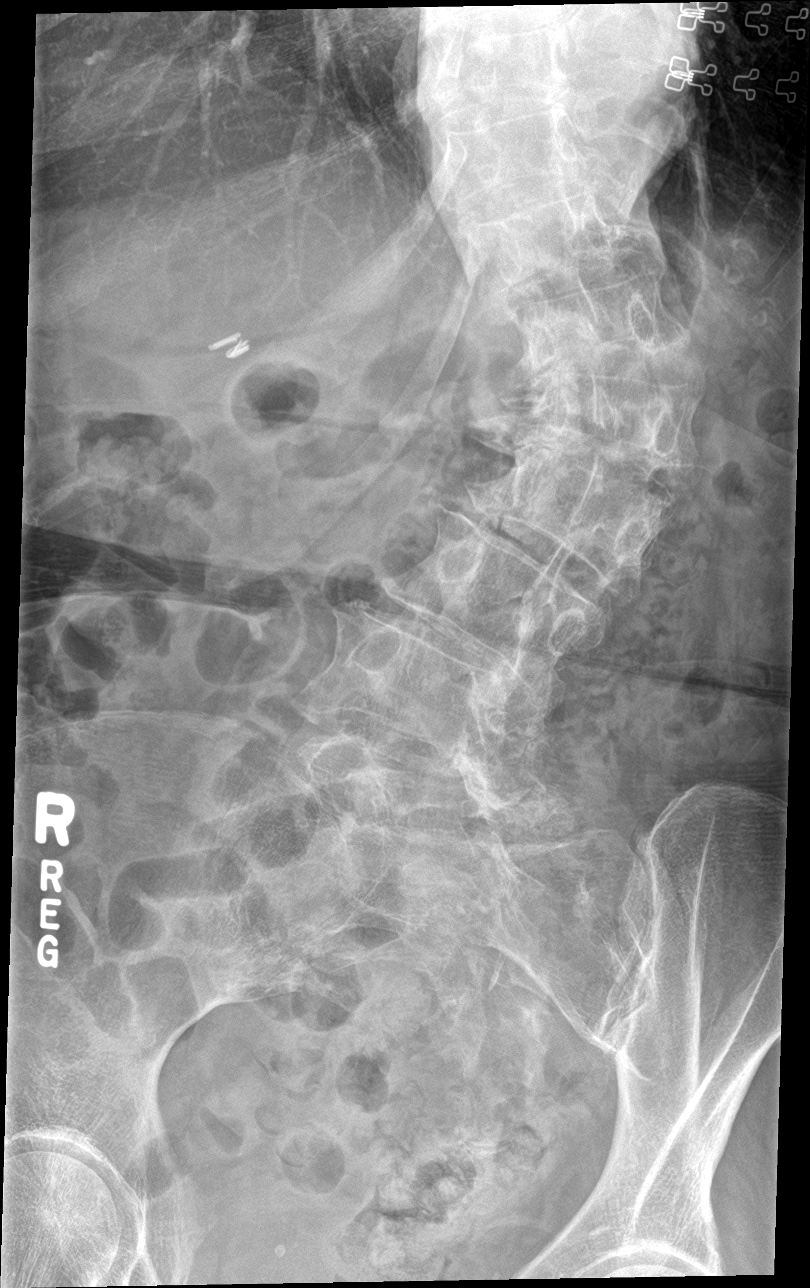

[l-spine obl (2 of 2)]
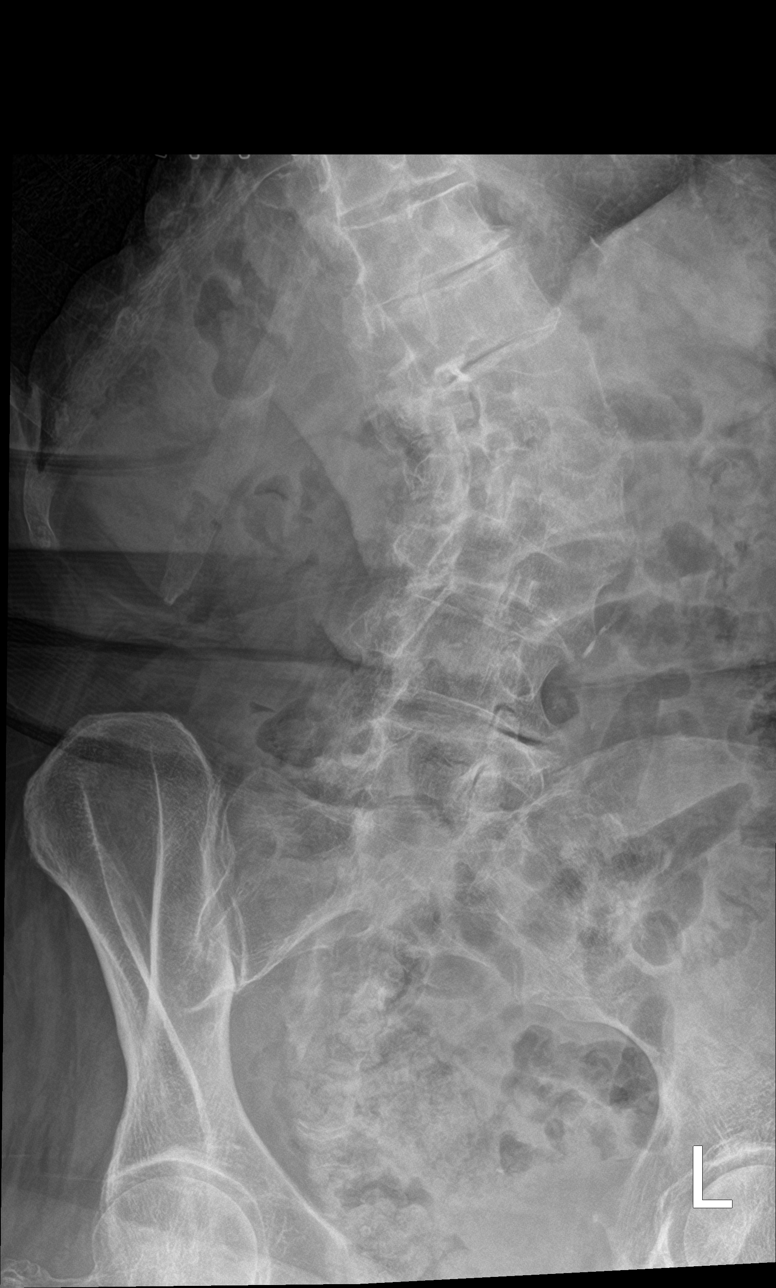

[l-spine lat]
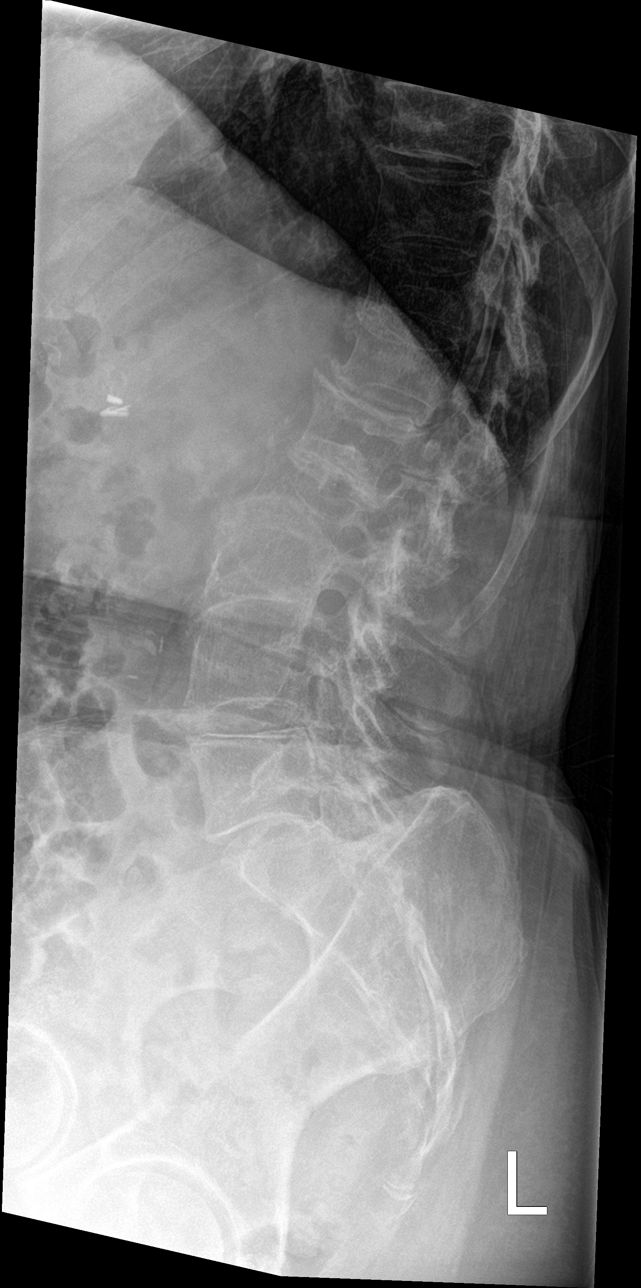

[l-spine spot]
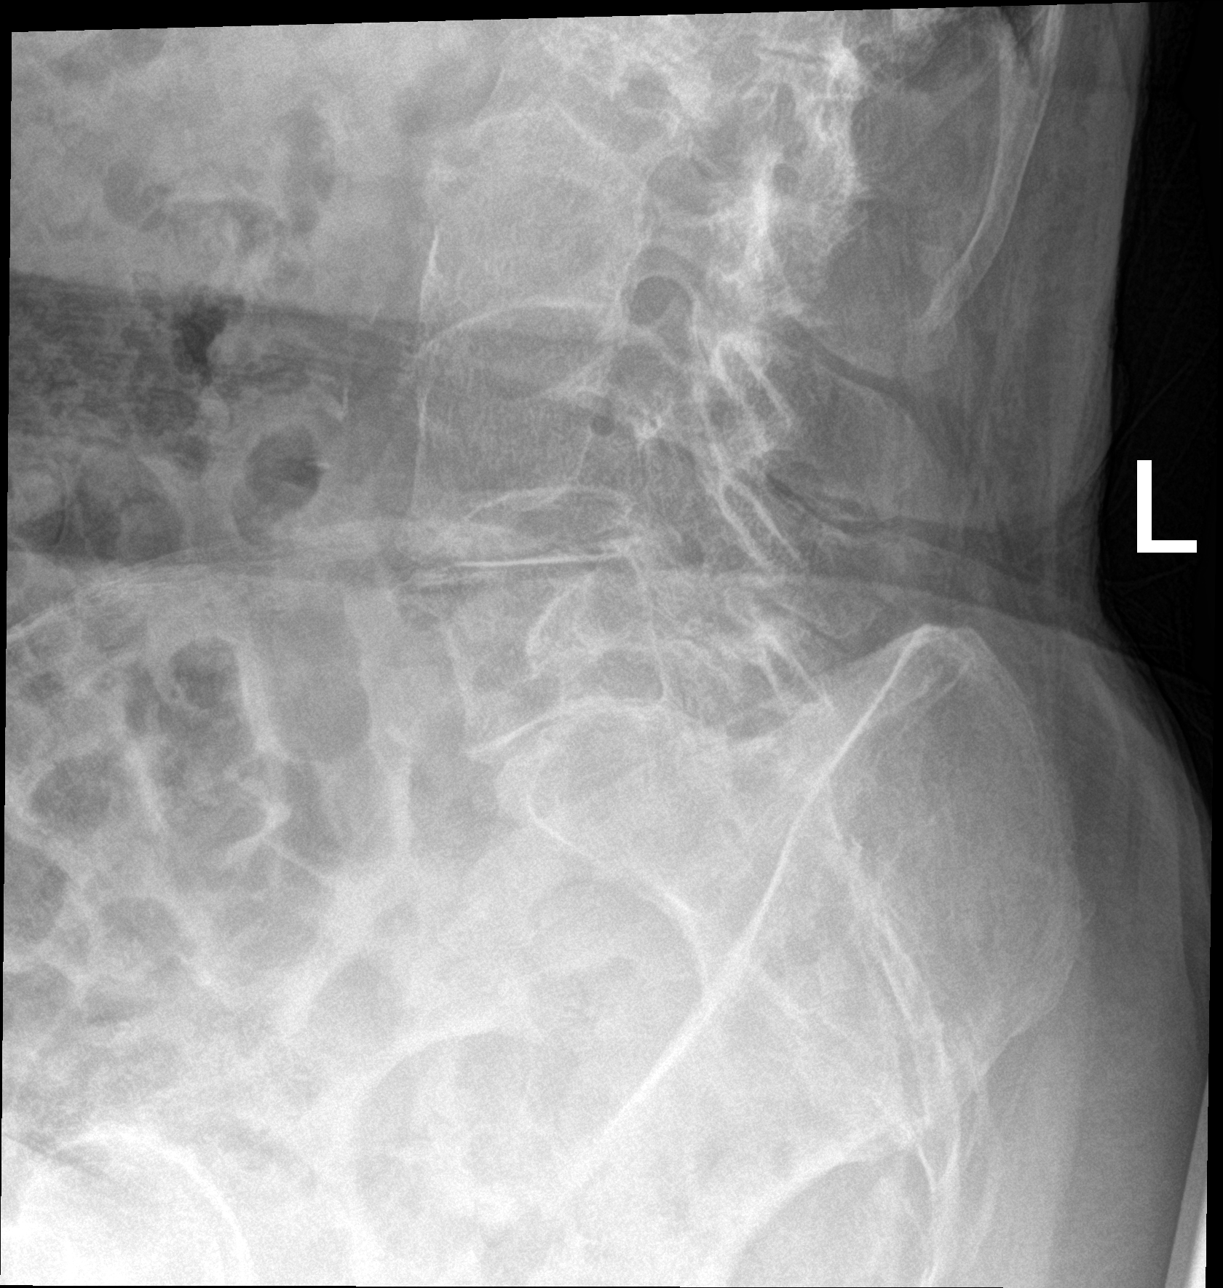

[5 of 5 positions shown; findings below may reference images not displayed]

FINDINGS: Scoliosis concave to the right of the lumbar spine is noted.
Osteophytic changes are noted. No pars defects are identified. No
compression deformity is noted. No soft tissue abnormality is seen.
IMPRESSION: Multilevel degenerative change similar to that seen on the prior
exam. Stable scoliosis is noted.

## 2020-02-22 ENCOUNTER — Other Ambulatory Visit: Payer: Self-pay | Admitting: Family Medicine

## 2020-03-04 ENCOUNTER — Ambulatory Visit: Payer: Medicare Other | Admitting: Family Medicine

## 2020-03-12 ENCOUNTER — Encounter: Payer: Self-pay | Admitting: Family Medicine

## 2020-03-12 ENCOUNTER — Other Ambulatory Visit: Payer: Self-pay

## 2020-03-12 ENCOUNTER — Ambulatory Visit (INDEPENDENT_AMBULATORY_CARE_PROVIDER_SITE_OTHER): Payer: Medicare Other | Admitting: Family Medicine

## 2020-03-12 VITALS — BP 138/82 | HR 65 | Ht 63.0 in | Wt 150.0 lb

## 2020-03-12 DIAGNOSIS — E785 Hyperlipidemia, unspecified: Secondary | ICD-10-CM

## 2020-03-12 DIAGNOSIS — I10 Essential (primary) hypertension: Secondary | ICD-10-CM

## 2020-03-12 DIAGNOSIS — M170 Bilateral primary osteoarthritis of knee: Secondary | ICD-10-CM | POA: Diagnosis not present

## 2020-03-12 DIAGNOSIS — N1831 Chronic kidney disease, stage 3a: Secondary | ICD-10-CM | POA: Diagnosis not present

## 2020-03-12 NOTE — Assessment & Plan Note (Signed)
Using her tumeric and diclofenac.  Occ uses her Cymbalta.

## 2020-03-12 NOTE — Progress Notes (Signed)
Established Patient Office Visit  Subjective:  Patient ID: Katherine Cohen, female    DOB: December 27, 1940  Age: 80 y.o. MRN: [id number]  CC:  Chief Complaint  Patient presents with  . Hypertension    HPI Katherine Cohen presents for   Hypertension- Pt denies chest pain, SOB, dizziness, or heart palpitations.  Taking meds as directed w/o problems.  Denies medication side effects.    Hyperlipidemia - tolerating stating well with no myalgias or significant side effects.  Lab Results  Component Value Date   CHOL 149 08/14/2019   HDL 56 08/14/2019   LDLCALC 74 08/14/2019   TRIG 103 08/14/2019   CHOLHDL 2.7 08/14/2019        Past Medical History:  Diagnosis Date  . Asthma    childhood asthma  . Cancer ([place]) 1998   L neck , radiation  . Edema    leg  . GERD (gastroesophageal reflux disease)   . Hyperlipidemia   . Hypertension   . OA (osteoarthritis) of knee     Past Surgical History:  Procedure Laterality Date  . CHOLECYSTECTOMY  05/22/2011   Procedure: LAPAROSCOPIC CHOLECYSTECTOMY WITH INTRAOPERATIVE CHOLANGIOGRAM;  Surgeon: Imogene Burn. Georgette Dover, MD;  Location: [facility];  Service: General;  Laterality: N/A;  . HERNIA REPAIR     left  inguinal hernia repair   . NECK DISSECTION     L  . OTHER SURGICAL HISTORY     right ankle ulcerated wound I and D   . VENTRAL HERNIA REPAIR  05/22/2011   Procedure: HERNIA REPAIR VENTRAL ADULT;  Surgeon: Imogene Burn. Georgette Dover, MD;  Location: [facility];  Service: General;  Laterality: N/A;    Family History  Problem Relation Age of Onset  . Stroke Mother   . Heart disease Father        AMI  . Lung cancer Brother        lung    Social History   Socioeconomic History  . Marital status: Married    Spouse name: Not on file  . Number of children: Not on file  . Years of education: Not on file  . Highest education level: Not on file  Occupational History  . Not on file  Tobacco Use  . Smoking status: Never Smoker  . Smokeless tobacco:  Never Used  Substance and Sexual Activity  . Alcohol use: Yes    Alcohol/week: 7.0 standard drinks    Types: 7 Glasses of wine per week    Comment: red wine   . Drug use: No    Types: Hydromorphone  . Sexual activity: Not on file  Other Topics Concern  . Not on file  Social History Narrative  . Not on file   Social Determinants of Health   Financial Resource Strain: Not on file  Food Insecurity: Not on file  Transportation Needs: Not on file  Physical Activity: Not on file  Stress: Not on file  Social Connections: Not on file  Intimate Partner Violence: Not on file    Outpatient Medications Prior to Visit  Medication Sig Dispense Refill  . chlorthalidone (HYGROTON) 25 MG tablet TAKE 1 TABLET BY MOUTH  DAILY 90 tablet 3  . diclofenac (VOLTAREN) 75 MG EC tablet TAKE 1 TABLET BY MOUTH  TWICE DAILY AS NEEDED 180 tablet 3  . DULoxetine (CYMBALTA) 30 MG capsule Take 1 capsule (30 mg total) by mouth daily. 90 capsule 3  . lisinopril (ZESTRIL) 40 MG tablet TAKE 1 TABLET BY MOUTH  DAILY 90 tablet 3  . metoprolol tartrate (LOPRESSOR) 50 MG tablet TAKE 1 TABLET BY MOUTH  TWICE DAILY 180 tablet 3  . omeprazole (PRILOSEC) 40 MG capsule TAKE 1 CAPSULE BY MOUTH  DAILY 90 capsule 3  . pravastatin (PRAVACHOL) 20 MG tablet TAKE 1 TABLET BY MOUTH AT  BEDTIME 90 tablet 3  . Turmeric Curcumin 500 MG CAPS Take 500 mg by mouth 2 (two) times daily.    Marland Kitchen triamcinolone ointment (KENALOG) 0.5 % Apply 1 application topically daily as needed. 45 g 0   No facility-administered medications prior to visit.    Allergies  Allergen Reactions  . Tramadol Nausea Only    ROS Review of Systems    Objective:    Physical Exam Constitutional:      Appearance: She is well-developed and well-nourished.  HENT:     Head: Normocephalic and atraumatic.  Cardiovascular:     Rate and Rhythm: Normal rate and regular rhythm.     Heart sounds: Normal heart sounds.  Pulmonary:     Effort: Pulmonary effort is  normal.     Breath sounds: Normal breath sounds.  Skin:    General: Skin is warm and dry.  Neurological:     Mental Status: She is alert and oriented to person, place, and time.  Psychiatric:        Mood and Affect: Mood and affect normal.        Behavior: Behavior normal.     BP 138/82   Pulse 65   Ht 5\' 3"  (1.6 m)   Wt 150 lb (68 kg)   SpO2 100%   BMI 26.57 kg/m  Wt Readings from Last 3 Encounters:  03/12/20 150 lb (68 kg)  09/21/19 153 lb (69.4 kg)  08/14/19 153 lb (69.4 kg)     Health Maintenance Due  Topic Date Due  . Hepatitis C Screening  Never done  . TETANUS/TDAP  08/03/2017  . INFLUENZA VACCINE  09/24/2019    There are no preventive care reminders to display for this patient.  Lab Results  Component Value Date   TSH 2.746 08/21/2009   Lab Results  Component Value Date   WBC 12.3 (H) 01/22/2016   HGB 10.3 (L) 01/22/2016   HCT 34.0 (L) 01/22/2016   MCV 87.0 01/22/2016   PLT 256 01/22/2016   Lab Results  Component Value Date   NA 140 08/14/2019   K 4.2 08/14/2019   CO2 28 08/14/2019   GLUCOSE 96 08/14/2019   BUN 29 (H) 08/14/2019   CREATININE 1.17 (H) 08/14/2019   BILITOT 0.5 08/14/2019   ALKPHOS 70 05/04/2016   AST 14 08/14/2019   ALT 8 08/14/2019   PROT 6.9 08/14/2019   ALBUMIN 4.4 05/04/2016   CALCIUM 9.4 08/14/2019   Lab Results  Component Value Date   CHOL 149 08/14/2019   Lab Results  Component Value Date   HDL 56 08/14/2019   Lab Results  Component Value Date   LDLCALC 74 08/14/2019   Lab Results  Component Value Date   TRIG 103 08/14/2019   Lab Results  Component Value Date   CHOLHDL 2.7 08/14/2019   No results found for: HGBA1C    Assessment & Plan:   Problem List Items Addressed This Visit      Cardiovascular and Mediastinum   ESSENTIAL HYPERTENSION, BENIGN - Primary    Well controlled. Continue current regimen. Follow up in  6 monthg. Due for BMP.  Relevant Orders   BASIC METABOLIC PANEL WITH GFR    Urine Microalbumin w/creat. ratio     Musculoskeletal and Integument   OA (osteoarthritis) of knee    Using her tumeric and diclofenac.  Occ uses her Cymbalta.         Other   Hyperlipidemia    Continue statin.       Relevant Orders   BASIC METABOLIC PANEL WITH GFR   Urine Microalbumin w/creat. ratio    Other Visit Diagnoses    Stage 3a chronic kidney disease ([place])       Relevant Orders   BASIC METABOLIC PANEL WITH GFR   Urine Microalbumin w/creat. ratio      No orders of the defined types were placed in this encounter.   Follow-up: Return in about 6 months (around 09/09/2020) for Hypertension.    Beatrice Lecher, MD

## 2020-03-12 NOTE — Assessment & Plan Note (Signed)
Continue statin. 

## 2020-03-12 NOTE — Assessment & Plan Note (Signed)
Well controlled. Continue current regimen. Follow up in  6 monthg. Due for BMP.

## 2020-06-24 ENCOUNTER — Ambulatory Visit (INDEPENDENT_AMBULATORY_CARE_PROVIDER_SITE_OTHER): Payer: Medicare Other | Admitting: Family Medicine

## 2020-06-24 ENCOUNTER — Other Ambulatory Visit: Payer: Self-pay

## 2020-06-24 ENCOUNTER — Encounter: Payer: Self-pay | Admitting: Family Medicine

## 2020-06-24 VITALS — BP 142/78 | HR 56 | Ht 63.0 in | Wt 141.0 lb

## 2020-06-24 DIAGNOSIS — R29898 Other symptoms and signs involving the musculoskeletal system: Secondary | ICD-10-CM | POA: Diagnosis not present

## 2020-06-24 DIAGNOSIS — I10 Essential (primary) hypertension: Secondary | ICD-10-CM | POA: Diagnosis not present

## 2020-06-24 DIAGNOSIS — M549 Dorsalgia, unspecified: Secondary | ICD-10-CM

## 2020-06-24 DIAGNOSIS — E785 Hyperlipidemia, unspecified: Secondary | ICD-10-CM | POA: Diagnosis not present

## 2020-06-24 DIAGNOSIS — N1831 Chronic kidney disease, stage 3a: Secondary | ICD-10-CM | POA: Diagnosis not present

## 2020-06-24 MED ORDER — AMBULATORY NON FORMULARY MEDICATION: 0 refills | Status: AC

## 2020-06-24 MED ORDER — LIDOCAINE 4 % EX PTCH: 1.0000 | MEDICATED_PATCH | Freq: Every day | CUTANEOUS | 3 refills | Status: DC

## 2020-06-24 NOTE — Progress Notes (Signed)
Acute Office Visit  Subjective:    Patient ID: Katherine Cohen, female    DOB: Apr 23, 1940, 80 y.o.   MRN: [id number]  Chief Complaint  Patient presents with  . Back Pain    HPI Patient is in today for back pain.  Last x-ray was in November showing multilevel degenerative changes similar to prior exam.  Some stable scoliosis.  No pars defect.  He also has a history of facet injections in the lumbar spine particularly at L5-S1 back in 2014 so about 7 years ago.  She is mostly been relying on Tylenol.  She is not currently taking the Cymbalta.  She said she was at her son's house and having to use the stairs to go up and down to the bathroom and was using both hands and arms to pull herself up and thinks that is what really flared her back.  She does have a history of chronic back pain.  She has tried injections in the past without significant relief.  She has never tried a TENS unit.  She has tried physical therapy and felt like it was not helpful and is not interested in doing that again.  She says the pain is definitely worse with bending or twisting such as with vacuuming.  She said she might be interested in a lidocaine patch.  The Cymbalta makes her little bit nauseated. Has not tried heat or ice.  Past Medical History:  Diagnosis Date  . Asthma    childhood asthma  . 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 Edema    leg  . GERD (gastroesophageal reflux disease)   . Hyperlipidemia   . Hypertension   . OA (osteoarthritis) of knee     Past Surgical History:  Procedure Laterality Date  . CHOLECYSTECTOMY  05/22/2011   Procedure: LAPAROSCOPIC CHOLECYSTECTOMY WITH INTRAOPERATIVE CHOLANGIOGRAM;  Surgeon: Imogene Burn. Georgette Dover, MD;  Location: [facility];  Service: General;  Laterality: N/A;  . HERNIA REPAIR     left  inguinal hernia repair   . NECK DISSECTION     L  . OTHER SURGICAL HISTORY     right ankle ulcerated wound I and D   . VENTRAL HERNIA REPAIR  05/22/2011   Procedure: HERNIA  REPAIR VENTRAL ADULT;  Surgeon: Imogene Burn. Georgette Dover, MD;  Location: [facility];  Service: General;  Laterality: N/A;    Family History  Problem Relation Age of Onset  . Stroke Mother   . Heart disease Father        AMI  . Lung cancer Brother        lung    Social History   Socioeconomic History  . Marital status: Married    Spouse name: Not on file  . Number of children: Not on file  . Years of education: Not on file  . Highest education level: Not on file  Occupational History  . Not on file  Tobacco Use  . Smoking status: Never Smoker  . Smokeless tobacco: Never Used  Substance and Sexual Activity  . Alcohol use: Yes    Alcohol/week: 7.0 standard drinks    Types: 7 Glasses of wine per week    Comment: red wine   . Drug use: No    Types: Hydromorphone  . Sexual activity: Not on file  Other Topics Concern  . Not on file  Social History Narrative  . Not on file   Social Determinants of Health   Financial Resource Strain:  Not on file  Food Insecurity: Not on file  Transportation Needs: Not on file  Physical Activity: Not on file  Stress: Not on file  Social Connections: Not on file  Intimate Partner Violence: Not on file    Outpatient Medications Prior to Visit  Medication Sig Dispense Refill  . chlorthalidone (HYGROTON) 25 MG tablet TAKE 1 TABLET BY MOUTH  DAILY 90 tablet 3  . diclofenac (VOLTAREN) 75 MG EC tablet TAKE 1 TABLET BY MOUTH  TWICE DAILY AS NEEDED 180 tablet 3  . DULoxetine (CYMBALTA) 30 MG capsule Take 1 capsule (30 mg total) by mouth daily. 90 capsule 3  . lisinopril (ZESTRIL) 40 MG tablet TAKE 1 TABLET BY MOUTH  DAILY 90 tablet 3  . metoprolol tartrate (LOPRESSOR) 50 MG tablet TAKE 1 TABLET BY MOUTH  TWICE DAILY 180 tablet 3  . omeprazole (PRILOSEC) 40 MG capsule TAKE 1 CAPSULE BY MOUTH  DAILY 90 capsule 3  . pravastatin (PRAVACHOL) 20 MG tablet TAKE 1 TABLET BY MOUTH AT  BEDTIME 90 tablet 3  . Turmeric Curcumin 500 MG CAPS Take 500 mg by mouth 2 (two)  times daily.     No facility-administered medications prior to visit.    Allergies  Allergen Reactions  . Tramadol Nausea Only    Review of Systems     Objective:    Physical Exam Vitals reviewed.  Constitutional:      Appearance: She is well-developed.  HENT:     Head: Normocephalic and atraumatic.  Eyes:     Conjunctiva/sclera: Conjunctivae normal.  Cardiovascular:     Rate and Rhythm: Normal rate.  Pulmonary:     Effort: Pulmonary effort is normal.  Musculoskeletal:     Comments: She does have some swelling over the muscles on the left side of her back.  Nontender over the spine itself.  Skin:    General: Skin is dry.     Coloration: Skin is not pale.  Neurological:     Mental Status: She is alert and oriented to person, place, and time.  Psychiatric:        Behavior: Behavior normal.     BP (!) 142/78   Pulse (!) 56   Ht 5\' 3"  (1.6 m)   Wt 141 lb (64 kg)   SpO2 93%   BMI 24.98 kg/m  Wt Readings from Last 3 Encounters:  06/24/20 141 lb (64 kg)  03/12/20 150 lb (68 kg)  09/21/19 153 lb (69.4 kg)    Health Maintenance Due  Topic Date Due  . TETANUS/TDAP  08/03/2017    There are no preventive care reminders to display for this patient.   Lab Results  Component Value Date   TSH 2.746 08/21/2009   Lab Results  Component Value Date   WBC 12.3 (H) 01/22/2016   HGB 10.3 (L) 01/22/2016   HCT 34.0 (L) 01/22/2016   MCV 87.0 01/22/2016   PLT 256 01/22/2016   Lab Results  Component Value Date   NA 140 08/14/2019   K 4.2 08/14/2019   CO2 28 08/14/2019   GLUCOSE 96 08/14/2019   BUN 29 (H) 08/14/2019   CREATININE 1.17 (H) 08/14/2019   BILITOT 0.5 08/14/2019   ALKPHOS 70 05/04/2016   AST 14 08/14/2019   ALT 8 08/14/2019   PROT 6.9 08/14/2019   ALBUMIN 4.4 05/04/2016   CALCIUM 9.4 08/14/2019   Lab Results  Component Value Date   CHOL 149 08/14/2019   Lab Results  Component Value Date  HDL 56 08/14/2019   Lab Results  Component Value  Date   LDLCALC 74 08/14/2019   Lab Results  Component Value Date   TRIG 103 08/14/2019   Lab Results  Component Value Date   CHOLHDL 2.7 08/14/2019   No results found for: HGBA1C     Assessment & Plan:   Problem List Items Addressed This Visit   None   Visit Diagnoses    Mid back pain    -  Primary   Relevant Medications   Lidocaine (HM LIDOCAINE PATCH) 4 % PTCH   Weakness of both lower extremities       Relevant Medications   AMBULATORY NON FORMULARY MEDICATION     Today her back pain is more affected in her mid back versus her low back.  She does have some muscular swelling on the left mid side.  We discussed ice.  May be even considering a TENS unit but she was not interested in this time.  She would like to try lidocaine patch so that prescription was sent to the pharmacy we also discussed possibly retrying the Cymbalta.  Making sure to take it with a little food and water to try to reduce any nausea.  Explained that it can take a couple weeks to start kicking him but can be helpful for more chronic back pain.  Not interested in formal physical therapy or repeat facet injections.  Lower extremity weakness-she also wanted a prescription for a cane due to weakness in her lower extremities.  Prescription provided.  Meds ordered this encounter  Medications  . Lidocaine (HM LIDOCAINE PATCH) 4 % PTCH    Sig: Apply 1 patch topically daily.    Dispense:  30 patch    Refill:  3  . AMBULATORY NON FORMULARY MEDICATION    Sig: Medication Name: *Cane.  DX: Lower extremity weakness    Dispense:  1 Units    Refill:  0     Beatrice Lecher, MD

## 2020-06-25 ENCOUNTER — Encounter: Payer: Self-pay | Admitting: Family Medicine

## 2020-06-25 DIAGNOSIS — N1832 Chronic kidney disease, stage 3b: Secondary | ICD-10-CM | POA: Insufficient documentation

## 2020-06-25 LAB — BASIC METABOLIC PANEL WITH GFR
BUN/Creatinine Ratio: 29 (calc) — ABNORMAL HIGH (ref 6–22)
BUN: 32 mg/dL — ABNORMAL HIGH (ref 7–25)
CO2: 31 mmol/L (ref 20–32)
Calcium: 9.6 mg/dL (ref 8.6–10.4)
Chloride: 95 mmol/L — ABNORMAL LOW (ref 98–110)
Creat: 1.11 mg/dL — ABNORMAL HIGH (ref 0.60–0.88)
GFR, Est African American: 54 mL/min/{1.73_m2} — ABNORMAL LOW (ref >=60)
GFR, Est Non African American: 47 mL/min/{1.73_m2} — ABNORMAL LOW (ref >=60)
Glucose, Bld: 98 mg/dL (ref 65–99)
Potassium: 4.2 mmol/L (ref 3.5–5.3)
Sodium: 134 mmol/L — ABNORMAL LOW (ref 135–146)

## 2020-06-25 LAB — MICROALBUMIN / CREATININE URINE RATIO
Creatinine, Urine: 43 mg/dL (ref 20–275)
Microalb Creat Ratio: 14 mcg/mg creat (ref <30)
Microalb, Ur: 0.6 mg/dL

## 2020-07-05 ENCOUNTER — Telehealth: Payer: Self-pay | Admitting: Family Medicine

## 2020-07-05 NOTE — Progress Notes (Signed)
  Chronic Care Management   Note  07/05/2020 Name: Katherine Cohen MRN: [id number] DOB: 10/15/1940  Katherine Cohen is a 80 y.o. year old female who is a primary care patient of Metheney, Rene Kocher, MD. I reached out to Katherine Cohen by phone today in response to a referral sent by [name] PCP, Hali Marry, MD.   [name] was given information about Chronic Care Management services today including:  1. CCM service includes personalized support from designated clinical staff supervised by her physician, including individualized plan of care and coordination with other care providers 2. 24/7 contact phone numbers for assistance for urgent and routine care needs. 3. Service will only be billed when office clinical staff spend 20 minutes or more in a month to coordinate care. 4. Only one practitioner may furnish and bill the service in a calendar month. 5. The patient may stop CCM services at any time (effective at the end of the month) by phone call to the office staff.   Patient did not agree to enrollment in care management services and does not wish to consider at this time.  Follow up plan:  [place]

## 2020-07-23 ENCOUNTER — Other Ambulatory Visit: Payer: Self-pay | Admitting: Family Medicine

## 2020-07-24 ENCOUNTER — Other Ambulatory Visit: Payer: Self-pay | Admitting: Physician Assistant

## 2020-09-09 ENCOUNTER — Ambulatory Visit: Payer: Medicare Other | Admitting: Family Medicine

## 2020-10-10 ENCOUNTER — Other Ambulatory Visit: Payer: Self-pay | Admitting: Family Medicine

## 2020-10-26 ENCOUNTER — Other Ambulatory Visit: Payer: Self-pay

## 2020-10-26 ENCOUNTER — Ambulatory Visit (INDEPENDENT_AMBULATORY_CARE_PROVIDER_SITE_OTHER): Payer: Medicare Other

## 2020-10-26 DIAGNOSIS — Z0000 Encounter for general adult medical examination without abnormal findings: Secondary | ICD-10-CM

## 2020-10-26 NOTE — Patient Instructions (Addendum)
Katherine Cohen , Thank you for taking time to come for your Medicare Wellness Visit. I appreciate your ongoing commitment to your health goals. Please review the following plan we discussed and let me know if I can assist you in the future.   Screening recommendations/referrals: Colonoscopy: No longer required  Mammogram: No Longer required  Recommended yearly ophthalmology/optometry visit for glaucoma screening and checkup Recommended yearly dental visit for hygiene and checkup  Vaccinations: Influenza vaccine: Due Pneumococcal vaccine: Completed  Tdap vaccine: Due  Shingles vaccine: Shingrix discussed. Please contact your pharmacy for coverage information.    Covid-19:Completed 06/26/19 & 01/08/20  Advanced directives: Advance directive discussed with you today. Even though you declined this today please call our office should you change your mind and we can give you the proper paperwork for you to fill out.   Conditions/risks identified: None at this time  Next appointment: Follow up in one year for your annual wellness visit    Preventive Care 65 Years and Older, Female Preventive care refers to lifestyle choices and visits with your health care provider that can promote health and wellness. What does preventive care include? A yearly physical exam. This is also called an annual well check. Dental exams once or twice a year. Routine eye exams. Ask your health care provider how often you should have your eyes checked. Personal lifestyle choices, including: Daily care of your teeth and gums. Regular physical activity. Eating a healthy diet. Avoiding tobacco and drug use. Limiting alcohol use. Practicing safe sex. Taking low-dose aspirin every day. Taking vitamin and mineral supplements as recommended by your health care provider. What happens during an annual well check? The services and screenings done by your health care provider during your annual well check will depend on your  age, overall health, lifestyle risk factors, and family history of disease. Counseling  Your health care provider may ask you questions about your: Alcohol use. Tobacco use. Drug use. Emotional well-being. Home and relationship well-being. Sexual activity. Eating habits. History of falls. Memory and ability to understand (cognition). Work and work Statistician. Reproductive health. Screening  You may have the following tests or measurements: Height, weight, and BMI. Blood pressure. Lipid and cholesterol levels. These may be checked every 5 years, or more frequently if you are over 41 years old. Skin check. Lung cancer screening. You may have this screening every year starting at age 56 if you have a 30-pack-year history of smoking and currently smoke or have quit within the past 15 years. Fecal occult blood test (FOBT) of the stool. You may have this test every year starting at age 7. Flexible sigmoidoscopy or colonoscopy. You may have a sigmoidoscopy every 5 years or a colonoscopy every 10 years starting at age 22. Hepatitis C blood test. Hepatitis B blood test. Sexually transmitted disease (STD) testing. Diabetes screening. This is done by checking your blood sugar (glucose) after you have not eaten for a while (fasting). You may have this done every 1-3 years. Bone density scan. This is done to screen for osteoporosis. You may have this done starting at age 18. Mammogram. This may be done every 1-2 years. Talk to your health care provider about how often you should have regular mammograms. Talk with your health care provider about your test results, treatment options, and if necessary, the need for more tests. Vaccines  Your health care provider may recommend certain vaccines, such as: Influenza vaccine. This is recommended every year. Tetanus, diphtheria, and acellular pertussis (Tdap, Td)  vaccine. You may need a Td booster every 10 years. Zoster vaccine. You may need this after  age 33. Pneumococcal 13-valent conjugate (PCV13) vaccine. One dose is recommended after age 15. Pneumococcal polysaccharide (PPSV23) vaccine. One dose is recommended after age 66. Talk to your health care provider about which screenings and vaccines you need and how often you need them. This information is not intended to replace advice given to you by your health care provider. Make sure you discuss any questions you have with your health care provider. Document Released: 03/08/2015 Document Revised: 10/30/2015 Document Reviewed: 12/11/2014 Elsevier Interactive Patient Education  2017 [place] Prevention in the Home Falls can cause injuries. They can happen to people of all ages. There are many things you can do to make your home safe and to help prevent falls. What can I do on the outside of my home? Regularly fix the edges of walkways and driveways and fix any cracks. Remove anything that might make you trip as you walk through a door, such as a raised step or threshold. Trim any bushes or trees on the path to your home. Use bright outdoor lighting. Clear any walking paths of anything that might make someone trip, such as rocks or tools. Regularly check to see if handrails are loose or broken. Make sure that both sides of any steps have handrails. Any raised decks and porches should have guardrails on the edges. Have any leaves, snow, or ice cleared regularly. Use sand or salt on walking paths during winter. Clean up any spills in your garage right away. This includes oil or grease spills. What can I do in the bathroom? Use night lights. Install grab bars by the toilet and in the tub and shower. Do not use towel bars as grab bars. Use non-skid mats or decals in the tub or shower. If you need to sit down in the shower, use a plastic, non-slip stool. Keep the floor dry. Clean up any water that spills on the floor as soon as it happens. Remove soap buildup in the tub or shower  regularly. Attach bath mats securely with double-sided non-slip rug tape. Do not have throw rugs and other things on the floor that can make you trip. What can I do in the bedroom? Use night lights. Make sure that you have a light by your bed that is easy to reach. Do not use any sheets or blankets that are too big for your bed. They should not hang down onto the floor. Have a firm chair that has side arms. You can use this for support while you get dressed. Do not have throw rugs and other things on the floor that can make you trip. What can I do in the kitchen? Clean up any spills right away. Avoid walking on wet floors. Keep items that you use a lot in easy-to-reach places. If you need to reach something above you, use a strong step stool that has a grab bar. Keep electrical cords out of the way. Do not use floor polish or wax that makes floors slippery. If you must use wax, use non-skid floor wax. Do not have throw rugs and other things on the floor that can make you trip. What can I do with my stairs? Do not leave any items on the stairs. Make sure that there are handrails on both sides of the stairs and use them. Fix handrails that are broken or loose. Make sure that handrails are as long  as the stairways. Check any carpeting to make sure that it is firmly attached to the stairs. Fix any carpet that is loose or worn. Avoid having throw rugs at the top or bottom of the stairs. If you do have throw rugs, attach them to the floor with carpet tape. Make sure that you have a light switch at the top of the stairs and the bottom of the stairs. If you do not have them, ask someone to add them for you. What else can I do to help prevent falls? Wear shoes that: Do not have high heels. Have rubber bottoms. Are comfortable and fit you well. Are closed at the toe. Do not wear sandals. If you use a stepladder: Make sure that it is fully opened. Do not climb a closed stepladder. Make sure that  both sides of the stepladder are locked into place. Ask someone to hold it for you, if possible. Clearly mark and make sure that you can see: Any grab bars or handrails. First and last steps. Where the edge of each step is. Use tools that help you move around (mobility aids) if they are needed. These include: Canes. Walkers. Scooters. Crutches. Turn on the lights when you go into a dark area. Replace any light bulbs as soon as they burn out. Set up your furniture so you have a clear path. Avoid moving your furniture around. If any of your floors are uneven, fix them. If there are any pets around you, be aware of where they are. Review your medicines with your doctor. Some medicines can make you feel dizzy. This can increase your chance of falling. Ask your doctor what other things that you can do to help prevent falls. This information is not intended to replace advice given to you by your health care provider. Make sure you discuss any questions you have with your health care provider. Document Released: 12/06/2008 Document Revised: 07/18/2015 Document Reviewed: 03/16/2014 Elsevier Interactive Patient Education  2017 [employer].

## 2020-10-26 NOTE — Progress Notes (Signed)
Virtual Visit via Telephone Note  I connected with  Katherine Cohen on 10/26/20 at 10:00 AM EDT by telephone and verified that I am speaking with the correct person using two identifiers.  Medicare Annual Wellness visit completed telephonically due to Covid-19 pandemic.   Persons participating in this call: This Health Coach and this patient.   Location: Patient: Home Provider: Office   I discussed the limitations, risks, security and privacy concerns of performing an evaluation and management service by telephone and the availability of in person appointments. The patient expressed understanding and agreed to proceed.  Unable to perform video visit due to video visit attempted and failed and/or patient does not have video capability.   Some vital signs may be absent or patient reported.   Willette Brace, LPN   Subjective:   Katherine Cohen is a 80 y.o. female who presents for an Initial Medicare Annual Wellness Visit.  Review of Systems     Cardiac Risk Factors include: advanced age (>46mn, >>18women);hypertension;dyslipidemia;sedentary lifestyle     Objective:    There were no vitals filed for this visit. There is no height or weight on file to calculate BMI.  Advanced Directives 10/26/2020 07/19/2013 05/22/2011 05/14/2011  Does Patient Have a Medical Advance Directive? No Patient does not have advance directive;Patient would like information Patient does not have advance directive;Patient would not like information Patient does not have advance directive  Would patient like information on creating a medical advance directive? - Advance directive brochure given (Outpatient ONLY) - -  Pre-existing out of facility DNR order (yellow form or pink MOST form) - - No No    Current Medications (verified) Outpatient Encounter Medications as of 10/26/2020  Medication Sig   AMBULATORY NON FORMULARY MEDICATION Medication Name: *Cane.  DX: Lower extremity weakness   chlorthalidone  (HYGROTON) 25 MG tablet TAKE 1 TABLET BY MOUTH  DAILY   diclofenac (VOLTAREN) 75 MG EC tablet TAKE 1 TABLET BY MOUTH  TWICE DAILY AS NEEDED   DULoxetine (CYMBALTA) 30 MG capsule Take 1 capsule (30 mg total) by mouth daily.   lisinopril (ZESTRIL) 40 MG tablet TAKE 1 TABLET BY MOUTH  DAILY   metoprolol tartrate (LOPRESSOR) 50 MG tablet TAKE 1 TABLET BY MOUTH  TWICE DAILY   omeprazole (PRILOSEC) 40 MG capsule TAKE 1 CAPSULE BY MOUTH  DAILY   pravastatin (PRAVACHOL) 20 MG tablet Take 1 tablet (20 mg total) by mouth at bedtime. Need lab work for future refills.   Turmeric Curcumin 500 MG CAPS Take 500 mg by mouth 2 (two) times daily.   Lidocaine (HM LIDOCAINE PATCH) 4 % PTCH Apply 1 patch topically daily. (Patient not taking: Reported on 10/26/2020)   No facility-administered encounter medications on file as of 10/26/2020.    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 , radiation   Cataract    Edema    leg   GERD (gastroesophageal reflux disease)    Hyperlipidemia    Hypertension    OA (osteoarthritis) of knee    Past Surgical History:  Procedure Laterality Date   CHOLECYSTECTOMY  05/22/2011   Procedure: LAPAROSCOPIC CHOLECYSTECTOMY WITH INTRAOPERATIVE CHOLANGIOGRAM;  Surgeon: MImogene Burn TGeorgette Dover MD;  Location: [facility];  Service: General;  Laterality: N/A;   HERNIA REPAIR     left  inguinal hernia repair    NECK DISSECTION  L   OTHER SURGICAL HISTORY     right ankle ulcerated wound I and D    VENTRAL HERNIA REPAIR  05/22/2011   Procedure: HERNIA REPAIR VENTRAL ADULT;  Surgeon: Imogene Burn. Georgette Dover, MD;  Location: [facility];  Service: General;  Laterality: N/A;   Family History  Problem Relation Age of Onset   Stroke Mother    Heart disease Father        AMI   Lung cancer Brother        lung   Social History   Socioeconomic History   Marital status: Widowed    Spouse name: Not on  file   Number of children: Not on file   Years of education: Not on file   Highest education level: Not on file  Occupational History   Not on file  Tobacco Use   Smoking status: Never   Smokeless tobacco: Never  Substance and Sexual Activity   Alcohol use: Yes    Alcohol/week: 7.0 standard drinks    Types: 7 Glasses of wine per week    Comment: red wine    Drug use: No    Types: Hydromorphone   Sexual activity: Not on file  Other Topics Concern   Not on file  Social History Narrative   Not on file   Social Determinants of Health   Financial Resource Strain: Low Risk    Difficulty of Paying Living Expenses: Not hard at all  Food Insecurity: No Food Insecurity   Worried About Charity fundraiser in the Last Year: Never true   Ran Out of Food in the Last Year: Never true  Transportation Needs: No Transportation Needs   Lack of Transportation (Medical): No   Lack of Transportation (Non-Medical): No  Physical Activity: Inactive   Days of Exercise per Week: 0 days   Minutes of Exercise per Session: 0 min  Stress: No Stress Concern Present   Feeling of Stress : Not at all  Social Connections: Moderately Isolated   Frequency of Communication with Friends and Family: More than three times a week   Frequency of Social Gatherings with Friends and Family: Once a week   Attends Religious Services: 1 to 4 times per year   Active Member of [employer] or Organizations: No   Attends Archivist Meetings: Never   Marital Status: Widowed    Tobacco Counseling Counseling given: Not Answered   Clinical Intake:  Pre-visit preparation completed: Yes  Pain : No/denies pain     BMI - recorded: 24.98 Nutritional Status: BMI of 19-24  Normal Nutritional Risks: None Diabetes: No  How often do you need to have someone help you when you read instructions, pamphlets, or other written materials from your doctor or pharmacy?: 1 - Never  Diabetic?No  Interpreter Needed?:  No  Information entered by :: Katherine Rakes, LPN   Activities of Daily Living In your present state of health, do you have any difficulty performing the following activities: 10/26/2020  Hearing? Y  Comment Slight HOH  Vision? Y  Comment need laser surgery per pt  Difficulty concentrating or making decisions? N  Walking or climbing stairs? N  Comment no stairs in the home and uses a cane for safety  Dressing or bathing? N  Doing errands, shopping? N  Preparing Food and eating ? N  Using the Toilet? N  In the past six months, have you accidently leaked urine? N  Do you have problems with loss of bowel control?  N  Managing your Medications? N  Managing your Finances? N  Housekeeping or managing your Housekeeping? N  Some recent data might be hidden    Patient Care Team: Hali Marry, MD as PCP - General (Family Medicine)  Indicate any recent Medical Services you may have received from other than [facility] providers in the past year (date may be approximate).     Assessment:   This is a routine wellness examination for Katherine Cohen.  Hearing/Vision screen Hearing Screening - Comments:: Pt stated slight HOH Vision Screening - Comments:: Pt follows up with dr Domingo Cocking for annual eye exams   Dietary issues and exercise activities discussed: Current Exercise Habits: The patient does not participate in regular exercise at present   Goals Addressed             This Visit's Progress    Patient Stated       None at this time       Depression Screen PHQ 2/9 Scores 10/26/2020 06/24/2020 03/12/2020 09/21/2019 01/03/2019 04/12/2018 12/11/2016  PHQ - 2 Score 0 0 3 0 0 0 1  PHQ- 9 Score - - - 2 - - 8    Fall Risk Fall Risk  10/26/2020 06/24/2020 08/14/2019 01/03/2019 04/12/2018  Falls in the past year? 1 0 0 0 0  Number falls in past yr: 1 0 - 0 0  Injury with Fall? 1 0 - 0 0  Risk for fall due to : Impaired balance/gait;Impaired mobility;Impaired vision No Fall Risks No Fall Risks -  -  Follow up Falls prevention discussed Falls evaluation completed - Falls prevention discussed Falls evaluation completed    FALL RISK PREVENTION PERTAINING TO THE HOME:  Any stairs in or around the home? No  If so, are there any without handrails? No  Home free of loose throw rugs in walkways, pet beds, electrical cords, etc? Yes  Adequate lighting in your home to reduce risk of falls? Yes   ASSISTIVE DEVICES UTILIZED TO PREVENT FALLS:  Life alert? No  Use of a cane, walker or w/c? Yes  Grab bars in the bathroom? Yes  Shower chair or bench in shower? Yes  Elevated toilet seat or a handicapped toilet? No   TIMED UP AND GO:  Was the test performed? No .   Cognitive Function:     6CIT Screen 10/26/2020  What Year? 0 points  What month? 0 points  What time? 0 points  Count back from 20 0 points  Months in reverse 4 points  Repeat phrase 2 points  Total Score 6    Immunizations Immunization History  Administered Date(s) Administered   Janssen (J&J) SARS-COV-2 Vaccination 06/26/2019   Moderna Sars-Covid-2 Vaccination 01/08/2020   Pneumococcal Conjugate-13 03/22/2014   Pneumococcal Polysaccharide-23 08/04/2007   Td 08/04/2007    TDAP status: Due, Education has been provided regarding the importance of this vaccine. Advised may receive this vaccine at local pharmacy or Health Dept. Aware to provide a copy of the vaccination record if obtained from local pharmacy or Health Dept. Verbalized acceptance and understanding.  Flu Vaccine status: Due, Education has been provided regarding the importance of this vaccine. Advised may receive this vaccine at local pharmacy or Health Dept. Aware to provide a copy of the vaccination record if obtained from local pharmacy or Health Dept. Verbalized acceptance and understanding.  Pneumococcal vaccine status: Up to date  Covid-19 vaccine status: Completed vaccines  Qualifies for Shingles Vaccine? Yes   Zostavax completed No   Shingrix  Completed?: No.    Education has been provided regarding the importance of this vaccine. Patient has been advised to call insurance company to determine out of pocket expense if they have not yet received this vaccine. Advised may also receive vaccine at local pharmacy or Health Dept. Verbalized acceptance and understanding.  Screening Tests Health Maintenance  Topic Date Due   Zoster Vaccines- Shingrix (1 of 2) Never done   TETANUS/TDAP  08/03/2017   COVID-19 Vaccine (3 - Booster for Janssen series) 03/04/2020   INFLUENZA VACCINE  09/23/2020   COLONOSCOPY (Pts 45-82yr Insurance coverage will need to be confirmed)  01/03/2024 (Originally 04/29/2016)   DEXA SCAN  Completed   PNA vac Low Risk Adult  Completed   HPV VACCINES  Aged Out    Health Maintenance  Health Maintenance Due  Topic Date Due   Zoster Vaccines- Shingrix (1 of 2) Never done   TETANUS/TDAP  08/03/2017   COVID-19 Vaccine (3 - Booster for Janssen series) 03/04/2020   INFLUENZA VACCINE  09/23/2020    Colorectal cancer screening: No longer required.   Mammogram status: No longer required due to age.    Additional Screening:  Vision Screening: Recommended annual ophthalmology exams for early detection of glaucoma and other disorders of the eye. Is the patient up to date with their annual eye exam?  Yes  Who is the provider or what is the name of the office in which the patient attends annual eye exams? Dr FDomingo CockingIf pt is not established with a provider, would they like to be referred to a provider to establish care? No .   Dental Screening: Recommended annual dental exams for proper oral hygiene  Community Resource Referral / Chronic Care Management: CRR required this visit?  No   CCM required this visit?  No      Plan:     I have personally reviewed and noted the following in the patient's chart:   Medical and social history Use of alcohol, tobacco or illicit drugs  Current medications and supplements  including opioid prescriptions. Patient is not currently taking opioid prescriptions. Functional ability and status Nutritional status Physical activity Advanced directives List of other physicians Hospitalizations, surgeries, and ER visits in previous 12 months Vitals Screenings to include cognitive, depression, and falls Referrals and appointments  In addition, I have reviewed and discussed with patient certain preventive protocols, quality metrics, and best practice recommendations. A written personalized care plan for preventive services as well as general preventive health recommendations were provided to patient.     TWillette Brace LPN   [id number]  Nurse Notes: None

## 2020-10-31 ENCOUNTER — Other Ambulatory Visit: Payer: Self-pay | Admitting: Family Medicine

## 2020-12-10 ENCOUNTER — Encounter: Payer: Self-pay | Admitting: Family Medicine

## 2020-12-10 ENCOUNTER — Other Ambulatory Visit: Payer: Self-pay

## 2020-12-10 ENCOUNTER — Ambulatory Visit (INDEPENDENT_AMBULATORY_CARE_PROVIDER_SITE_OTHER): Payer: Medicare Other | Admitting: Family Medicine

## 2020-12-10 VITALS — BP 132/72 | HR 61 | Ht 63.0 in | Wt 144.0 lb

## 2020-12-10 DIAGNOSIS — R2681 Unsteadiness on feet: Secondary | ICD-10-CM | POA: Diagnosis not present

## 2020-12-10 DIAGNOSIS — R296 Repeated falls: Secondary | ICD-10-CM

## 2020-12-10 DIAGNOSIS — E785 Hyperlipidemia, unspecified: Secondary | ICD-10-CM

## 2020-12-10 DIAGNOSIS — N1832 Chronic kidney disease, stage 3b: Secondary | ICD-10-CM

## 2020-12-10 DIAGNOSIS — I10 Essential (primary) hypertension: Secondary | ICD-10-CM

## 2020-12-10 NOTE — Assessment & Plan Note (Signed)
Due to recheck renal function. 

## 2020-12-10 NOTE — Progress Notes (Signed)
Established Patient Office Visit  Subjective:  Patient ID: Katherine Cohen, female    DOB: 13-Apr-1940  Age: 80 y.o. MRN: [id number]  CC:  Chief Complaint  Patient presents with   Hypertension    HPI Katherine Cohen presents for   Hypertension- Pt denies chest pain, SOB, dizziness, or heart palpitations.  Taking meds as directed w/o problems.  Denies medication side effects.     Hyperlipidemia - tolerating stating well with no myalgias or significant side effects.  Lab Results  Component Value Date   CHOL 149 08/14/2019   HDL 56 08/14/2019   LDLCALC 74 08/14/2019   TRIG 103 08/14/2019   CHOLHDL 2.7 08/14/2019    She also wanted to talk to me about falls she has been having several falls at home she says she will use a cane sometimes when she leaves the house but around the house does not use much she says she does not fall because she trips or has pain she feels like it has a lot to do with her balance being off.  She no longer drives and is homebound and wonders if she could have some physical therapy that might help her she is very fearful of breaking something  Past Medical History:  Diagnosis Date   Asthma    childhood asthma   Cancer ([place]) 02/24/1996   L neck , radiation   Cataract    Edema    leg   GERD (gastroesophageal reflux disease)    Hyperlipidemia    Hypertension    OA (osteoarthritis) of knee     Past Surgical History:  Procedure Laterality Date   CHOLECYSTECTOMY  05/22/2011   Procedure: LAPAROSCOPIC CHOLECYSTECTOMY WITH INTRAOPERATIVE CHOLANGIOGRAM;  Surgeon: Imogene Burn. Tsuei, MD;  Location: [facility];  Service: General;  Laterality: N/A;   HERNIA REPAIR     left  inguinal hernia repair    NECK DISSECTION     L   OTHER SURGICAL HISTORY     right ankle ulcerated wound I and D    VENTRAL HERNIA REPAIR  05/22/2011   Procedure: HERNIA REPAIR VENTRAL ADULT;  Surgeon: Imogene Burn. Georgette Dover, MD;  Location: [facility];  Service: General;  Laterality: N/A;    Family  History  Problem Relation Age of Onset   Stroke Mother    Heart disease Father        AMI   Lung cancer Brother        lung    Social History   Socioeconomic History   Marital status: Widowed    Spouse name: Not on file   Number of children: Not on file   Years of education: Not on file   Highest education level: Not on file  Occupational History   Not on file  Tobacco Use   Smoking status: Never   Smokeless tobacco: Never  Substance and Sexual Activity   Alcohol use: Yes    Alcohol/week: 7.0 standard drinks    Types: 7 Glasses of wine per week    Comment: red wine    Drug use: No    Types: Hydromorphone   Sexual activity: Not on file  Other Topics Concern   Not on file  Social History Narrative   Not on file   Social Determinants of Health   Financial Resource Strain: Low Risk    Difficulty of Paying Living Expenses: Not hard at all  Food Insecurity: No Food Insecurity   Worried About Charity fundraiser in  the Last Year: Never true   Ran Out of Food in the Last Year: Never true  Transportation Needs: No Transportation Needs   Lack of Transportation (Medical): No   Lack of Transportation (Non-Medical): No  Physical Activity: Inactive   Days of Exercise per Week: 0 days   Minutes of Exercise per Session: 0 min  Stress: No Stress Concern Present   Feeling of Stress : Not at all  Social Connections: Moderately Isolated   Frequency of Communication with Friends and Family: More than three times a week   Frequency of Social Gatherings with Friends and Family: Once a week   Attends Religious Services: 1 to 4 times per year   Active Member of [employer] or Organizations: No   Attends Archivist Meetings: Never   Marital Status: Widowed  Human resources officer Violence: Not At Risk   Fear of Current or Ex-Partner: No   Emotionally Abused: No   Physically Abused: No   Sexually Abused: No    Outpatient Medications Prior to Visit  Medication Sig Dispense Refill    AMBULATORY NON FORMULARY MEDICATION Medication Name: *Cane.  DX: Lower extremity weakness 1 Units 0   chlorthalidone (HYGROTON) 25 MG tablet TAKE 1 TABLET BY MOUTH  DAILY 90 tablet 3   diclofenac (VOLTAREN) 75 MG EC tablet TAKE 1 TABLET BY MOUTH  TWICE DAILY AS NEEDED 180 tablet 3   DULoxetine (CYMBALTA) 30 MG capsule Take 1 capsule (30 mg total) by mouth daily. 90 capsule 3   lisinopril (ZESTRIL) 40 MG tablet TAKE 1 TABLET BY MOUTH  DAILY 90 tablet 3   metoprolol tartrate (LOPRESSOR) 50 MG tablet TAKE 1 TABLET BY MOUTH  TWICE DAILY 180 tablet 1   omeprazole (PRILOSEC) 40 MG capsule TAKE 1 CAPSULE BY MOUTH  DAILY 90 capsule 3   pravastatin (PRAVACHOL) 20 MG tablet Take 1 tablet (20 mg total) by mouth at bedtime. Need lab work for future refills. 90 tablet 0   Turmeric Curcumin 500 MG CAPS Take 500 mg by mouth 2 (two) times daily.     Lidocaine (HM LIDOCAINE PATCH) 4 % PTCH Apply 1 patch topically daily. (Patient not taking: Reported on 10/26/2020) 30 patch 3   No facility-administered medications prior to visit.    Allergies  Allergen Reactions   Tramadol Nausea Only    ROS Review of Systems    Objective:    Physical Exam Constitutional:      Appearance: Normal appearance. She is well-developed.  HENT:     Head: Normocephalic and atraumatic.  Cardiovascular:     Rate and Rhythm: Normal rate and regular rhythm.     Heart sounds: Normal heart sounds.  Pulmonary:     Effort: Pulmonary effort is normal.     Breath sounds: Normal breath sounds.  Skin:    General: Skin is warm and dry.  Neurological:     Mental Status: She is alert and oriented to person, place, and time.  Psychiatric:        Behavior: Behavior normal.    BP 132/72   Pulse 61   Ht 5\' 3"  (1.6 m)   Wt 144 lb (65.3 kg)   SpO2 99%   BMI 25.51 kg/m  Wt Readings from Last 3 Encounters:  12/10/20 144 lb (65.3 kg)  06/24/20 141 lb (64 kg)  03/12/20 150 lb (68 kg)     Health Maintenance Due  Topic Date  Due   Zoster Vaccines- Shingrix (1 of 2) Never done  TETANUS/TDAP  08/03/2017   COVID-19 Vaccine (3 - Booster for Janssen series) 03/04/2020   INFLUENZA VACCINE  09/23/2020    There are no preventive care reminders to display for this patient.  Lab Results  Component Value Date   TSH 2.746 08/21/2009   Lab Results  Component Value Date   WBC 12.3 (H) 01/22/2016   HGB 10.3 (L) 01/22/2016   HCT 34.0 (L) 01/22/2016   MCV 87.0 01/22/2016   PLT 256 01/22/2016   Lab Results  Component Value Date   NA 134 (L) 06/24/2020   K 4.2 06/24/2020   CO2 31 06/24/2020   GLUCOSE 98 06/24/2020   BUN 32 (H) 06/24/2020   CREATININE 1.11 (H) 06/24/2020   BILITOT 0.5 08/14/2019   ALKPHOS 70 05/04/2016   AST 14 08/14/2019   ALT 8 08/14/2019   PROT 6.9 08/14/2019   ALBUMIN 4.4 05/04/2016   CALCIUM 9.6 06/24/2020   Lab Results  Component Value Date   CHOL 149 08/14/2019   Lab Results  Component Value Date   HDL 56 08/14/2019   Lab Results  Component Value Date   LDLCALC 74 08/14/2019   Lab Results  Component Value Date   TRIG 103 08/14/2019   Lab Results  Component Value Date   CHOLHDL 2.7 08/14/2019   No results found for: HGBA1C    Assessment & Plan:   Problem List Items Addressed This Visit       Cardiovascular and Mediastinum   ESSENTIAL HYPERTENSION, BENIGN - Primary    Well controlled. Continue current regimen. Follow up in  6 mo. Due for full labs.        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         Other   Hyperlipidemia    Due to recheck lipids.  Continue taking statin daily.      Frequent falls    Will place home health furl to help reduce falls that can hopefully help with balance and gait training.  He may Beavan make recommendations for either cane or walker etc.       Relevant Orders   Ambulatory referral to [facility]   Other Visit Diagnoses     Gait instability       Relevant Orders   Ambulatory referral to [place]       No orders of the defined types were placed in this encounter.   Follow-up: Return in about 6 months (around 06/10/2021) for bp,cholesterol.    Beatrice Lecher, MD

## 2020-12-10 NOTE — Assessment & Plan Note (Signed)
Due to recheck lipids.  Continue taking statin daily.

## 2020-12-10 NOTE — Assessment & Plan Note (Signed)
Well controlled. Continue current regimen. Follow up in  6 mo. Due for full labs.

## 2020-12-10 NOTE — Assessment & Plan Note (Signed)
Will place home health furl to help reduce falls that can hopefully help with balance and gait training.  He may Beavan make recommendations for either cane or walker etc.

## 2020-12-11 LAB — COMPLETE METABOLIC PANEL WITH GFR
AG Ratio: 1.5 (calc) (ref 1.0–2.5)
ALT: 7 U/L (ref 6–29)
AST: 11 U/L (ref 10–35)
Albumin: 4.2 g/dL (ref 3.6–5.1)
Alkaline phosphatase (APISO): 68 U/L (ref 37–153)
BUN/Creatinine Ratio: 31 (calc) — ABNORMAL HIGH (ref 6–22)
BUN: 35 mg/dL — ABNORMAL HIGH (ref 7–25)
CO2: 29 mmol/L (ref 20–32)
Calcium: 9.5 mg/dL (ref 8.6–10.4)
Chloride: 97 mmol/L — ABNORMAL LOW (ref 98–110)
Creat: 1.14 mg/dL — ABNORMAL HIGH (ref 0.60–0.95)
Globulin: 2.8 g/dL (calc) (ref 1.9–3.7)
Glucose, Bld: 96 mg/dL (ref 65–99)
Potassium: 4.6 mmol/L (ref 3.5–5.3)
Sodium: 134 mmol/L — ABNORMAL LOW (ref 135–146)
Total Bilirubin: 0.5 mg/dL (ref 0.2–1.2)
Total Protein: 7 g/dL (ref 6.1–8.1)
eGFR: 49 mL/min/{1.73_m2} — ABNORMAL LOW (ref >=60)

## 2020-12-11 LAB — CBC
HCT: 32.9 % — ABNORMAL LOW (ref 35.0–45.0)
Hemoglobin: 8.9 g/dL — ABNORMAL LOW (ref 11.7–15.5)
MCH: 19.3 pg — ABNORMAL LOW (ref 27.0–33.0)
MCHC: 27.1 g/dL — ABNORMAL LOW (ref 32.0–36.0)
MCV: 71.5 fL — ABNORMAL LOW (ref 80.0–100.0)
MPV: 8.6 fL (ref 7.5–12.5)
Platelets: 390 10*3/uL (ref 140–400)
RBC: 4.6 10*6/uL (ref 3.80–5.10)
RDW: 18.1 % — ABNORMAL HIGH (ref 11.0–15.0)
WBC: 9.4 10*3/uL (ref 3.8–10.8)

## 2020-12-11 LAB — LIPID PANEL W/REFLEX DIRECT LDL
Cholesterol: 146 mg/dL (ref <200)
HDL: 59 mg/dL (ref >=50)
LDL Cholesterol (Calc): 67 mg/dL (calc)
Non-HDL Cholesterol (Calc): 87 mg/dL (calc) (ref <130)
Total CHOL/HDL Ratio: 2.5 (calc) (ref <5.0)
Triglycerides: 116 mg/dL (ref <150)

## 2020-12-11 NOTE — Progress Notes (Signed)
Pls call pt : she is VERY anemic. Is she seeing blood in her urine or stool?  We may need to check the urine and a stool card to check.  Let me know.

## 2020-12-13 ENCOUNTER — Other Ambulatory Visit: Payer: Self-pay | Admitting: *Deleted

## 2020-12-13 DIAGNOSIS — M47816 Spondylosis without myelopathy or radiculopathy, lumbar region: Secondary | ICD-10-CM | POA: Diagnosis not present

## 2020-12-13 DIAGNOSIS — Z1211 Encounter for screening for malignant neoplasm of colon: Secondary | ICD-10-CM

## 2020-12-13 DIAGNOSIS — J45909 Unspecified asthma, uncomplicated: Secondary | ICD-10-CM | POA: Diagnosis not present

## 2020-12-13 DIAGNOSIS — M1710 Unilateral primary osteoarthritis, unspecified knee: Secondary | ICD-10-CM | POA: Diagnosis not present

## 2020-12-13 DIAGNOSIS — I129 Hypertensive chronic kidney disease with stage 1 through stage 4 chronic kidney disease, or unspecified chronic kidney disease: Secondary | ICD-10-CM | POA: Diagnosis not present

## 2020-12-13 DIAGNOSIS — N1832 Chronic kidney disease, stage 3b: Secondary | ICD-10-CM | POA: Diagnosis not present

## 2020-12-13 DIAGNOSIS — H269 Unspecified cataract: Secondary | ICD-10-CM | POA: Diagnosis not present

## 2020-12-13 DIAGNOSIS — Z9181 History of falling: Secondary | ICD-10-CM | POA: Diagnosis not present

## 2020-12-13 DIAGNOSIS — K8010 Calculus of gallbladder with chronic cholecystitis without obstruction: Secondary | ICD-10-CM | POA: Diagnosis not present

## 2020-12-13 DIAGNOSIS — W19XXXD Unspecified fall, subsequent encounter: Secondary | ICD-10-CM | POA: Diagnosis not present

## 2020-12-13 DIAGNOSIS — M859 Disorder of bone density and structure, unspecified: Secondary | ICD-10-CM | POA: Diagnosis not present

## 2020-12-13 DIAGNOSIS — M545 Low back pain, unspecified: Secondary | ICD-10-CM | POA: Diagnosis not present

## 2020-12-13 DIAGNOSIS — G8929 Other chronic pain: Secondary | ICD-10-CM | POA: Diagnosis not present

## 2020-12-13 DIAGNOSIS — E785 Hyperlipidemia, unspecified: Secondary | ICD-10-CM | POA: Diagnosis not present

## 2020-12-13 DIAGNOSIS — K219 Gastro-esophageal reflux disease without esophagitis: Secondary | ICD-10-CM | POA: Diagnosis not present

## 2020-12-16 DIAGNOSIS — H02834 Dermatochalasis of left upper eyelid: Secondary | ICD-10-CM | POA: Diagnosis not present

## 2020-12-16 DIAGNOSIS — Z961 Presence of intraocular lens: Secondary | ICD-10-CM | POA: Diagnosis not present

## 2020-12-16 DIAGNOSIS — H353131 Nonexudative age-related macular degeneration, bilateral, early dry stage: Secondary | ICD-10-CM | POA: Diagnosis not present

## 2020-12-16 DIAGNOSIS — H52223 Regular astigmatism, bilateral: Secondary | ICD-10-CM | POA: Diagnosis not present

## 2020-12-16 DIAGNOSIS — H26493 Other secondary cataract, bilateral: Secondary | ICD-10-CM | POA: Diagnosis not present

## 2020-12-16 DIAGNOSIS — H43812 Vitreous degeneration, left eye: Secondary | ICD-10-CM | POA: Diagnosis not present

## 2020-12-16 DIAGNOSIS — H527 Unspecified disorder of refraction: Secondary | ICD-10-CM | POA: Diagnosis not present

## 2020-12-16 DIAGNOSIS — H02831 Dermatochalasis of right upper eyelid: Secondary | ICD-10-CM | POA: Diagnosis not present

## 2020-12-17 DIAGNOSIS — K8010 Calculus of gallbladder with chronic cholecystitis without obstruction: Secondary | ICD-10-CM | POA: Diagnosis not present

## 2020-12-17 DIAGNOSIS — K219 Gastro-esophageal reflux disease without esophagitis: Secondary | ICD-10-CM | POA: Diagnosis not present

## 2020-12-17 DIAGNOSIS — W19XXXD Unspecified fall, subsequent encounter: Secondary | ICD-10-CM | POA: Diagnosis not present

## 2020-12-17 DIAGNOSIS — Z9181 History of falling: Secondary | ICD-10-CM | POA: Diagnosis not present

## 2020-12-17 DIAGNOSIS — I129 Hypertensive chronic kidney disease with stage 1 through stage 4 chronic kidney disease, or unspecified chronic kidney disease: Secondary | ICD-10-CM | POA: Diagnosis not present

## 2020-12-17 DIAGNOSIS — M545 Low back pain, unspecified: Secondary | ICD-10-CM | POA: Diagnosis not present

## 2020-12-17 DIAGNOSIS — N1832 Chronic kidney disease, stage 3b: Secondary | ICD-10-CM | POA: Diagnosis not present

## 2020-12-17 DIAGNOSIS — M859 Disorder of bone density and structure, unspecified: Secondary | ICD-10-CM | POA: Diagnosis not present

## 2020-12-17 DIAGNOSIS — H269 Unspecified cataract: Secondary | ICD-10-CM | POA: Diagnosis not present

## 2020-12-17 DIAGNOSIS — E785 Hyperlipidemia, unspecified: Secondary | ICD-10-CM | POA: Diagnosis not present

## 2020-12-17 DIAGNOSIS — M47816 Spondylosis without myelopathy or radiculopathy, lumbar region: Secondary | ICD-10-CM | POA: Diagnosis not present

## 2020-12-17 DIAGNOSIS — J45909 Unspecified asthma, uncomplicated: Secondary | ICD-10-CM | POA: Diagnosis not present

## 2020-12-17 DIAGNOSIS — M1710 Unilateral primary osteoarthritis, unspecified knee: Secondary | ICD-10-CM | POA: Diagnosis not present

## 2020-12-17 DIAGNOSIS — G8929 Other chronic pain: Secondary | ICD-10-CM | POA: Diagnosis not present

## 2020-12-19 DIAGNOSIS — W19XXXD Unspecified fall, subsequent encounter: Secondary | ICD-10-CM | POA: Diagnosis not present

## 2020-12-19 DIAGNOSIS — N1832 Chronic kidney disease, stage 3b: Secondary | ICD-10-CM | POA: Diagnosis not present

## 2020-12-19 DIAGNOSIS — M1710 Unilateral primary osteoarthritis, unspecified knee: Secondary | ICD-10-CM | POA: Diagnosis not present

## 2020-12-19 DIAGNOSIS — J45909 Unspecified asthma, uncomplicated: Secondary | ICD-10-CM | POA: Diagnosis not present

## 2020-12-19 DIAGNOSIS — I129 Hypertensive chronic kidney disease with stage 1 through stage 4 chronic kidney disease, or unspecified chronic kidney disease: Secondary | ICD-10-CM | POA: Diagnosis not present

## 2020-12-19 DIAGNOSIS — E785 Hyperlipidemia, unspecified: Secondary | ICD-10-CM | POA: Diagnosis not present

## 2020-12-19 DIAGNOSIS — K8010 Calculus of gallbladder with chronic cholecystitis without obstruction: Secondary | ICD-10-CM | POA: Diagnosis not present

## 2020-12-19 DIAGNOSIS — M859 Disorder of bone density and structure, unspecified: Secondary | ICD-10-CM | POA: Diagnosis not present

## 2020-12-19 DIAGNOSIS — M545 Low back pain, unspecified: Secondary | ICD-10-CM | POA: Diagnosis not present

## 2020-12-19 DIAGNOSIS — G8929 Other chronic pain: Secondary | ICD-10-CM | POA: Diagnosis not present

## 2020-12-19 DIAGNOSIS — K219 Gastro-esophageal reflux disease without esophagitis: Secondary | ICD-10-CM | POA: Diagnosis not present

## 2020-12-19 DIAGNOSIS — Z9181 History of falling: Secondary | ICD-10-CM | POA: Diagnosis not present

## 2020-12-19 DIAGNOSIS — H269 Unspecified cataract: Secondary | ICD-10-CM | POA: Diagnosis not present

## 2020-12-19 DIAGNOSIS — M47816 Spondylosis without myelopathy or radiculopathy, lumbar region: Secondary | ICD-10-CM | POA: Diagnosis not present

## 2020-12-24 DIAGNOSIS — M47816 Spondylosis without myelopathy or radiculopathy, lumbar region: Secondary | ICD-10-CM | POA: Diagnosis not present

## 2020-12-24 DIAGNOSIS — Z9181 History of falling: Secondary | ICD-10-CM | POA: Diagnosis not present

## 2020-12-24 DIAGNOSIS — J45909 Unspecified asthma, uncomplicated: Secondary | ICD-10-CM | POA: Diagnosis not present

## 2020-12-24 DIAGNOSIS — K8010 Calculus of gallbladder with chronic cholecystitis without obstruction: Secondary | ICD-10-CM | POA: Diagnosis not present

## 2020-12-24 DIAGNOSIS — N1832 Chronic kidney disease, stage 3b: Secondary | ICD-10-CM | POA: Diagnosis not present

## 2020-12-24 DIAGNOSIS — M859 Disorder of bone density and structure, unspecified: Secondary | ICD-10-CM | POA: Diagnosis not present

## 2020-12-24 DIAGNOSIS — G8929 Other chronic pain: Secondary | ICD-10-CM | POA: Diagnosis not present

## 2020-12-24 DIAGNOSIS — I129 Hypertensive chronic kidney disease with stage 1 through stage 4 chronic kidney disease, or unspecified chronic kidney disease: Secondary | ICD-10-CM | POA: Diagnosis not present

## 2020-12-24 DIAGNOSIS — W19XXXD Unspecified fall, subsequent encounter: Secondary | ICD-10-CM | POA: Diagnosis not present

## 2020-12-24 DIAGNOSIS — H269 Unspecified cataract: Secondary | ICD-10-CM | POA: Diagnosis not present

## 2020-12-24 DIAGNOSIS — M1710 Unilateral primary osteoarthritis, unspecified knee: Secondary | ICD-10-CM | POA: Diagnosis not present

## 2020-12-24 DIAGNOSIS — K219 Gastro-esophageal reflux disease without esophagitis: Secondary | ICD-10-CM | POA: Diagnosis not present

## 2020-12-24 DIAGNOSIS — E785 Hyperlipidemia, unspecified: Secondary | ICD-10-CM | POA: Diagnosis not present

## 2020-12-24 DIAGNOSIS — M545 Low back pain, unspecified: Secondary | ICD-10-CM | POA: Diagnosis not present

## 2020-12-25 ENCOUNTER — Telehealth: Payer: Self-pay | Admitting: *Deleted

## 2020-12-25 DIAGNOSIS — I129 Hypertensive chronic kidney disease with stage 1 through stage 4 chronic kidney disease, or unspecified chronic kidney disease: Secondary | ICD-10-CM | POA: Diagnosis not present

## 2020-12-25 DIAGNOSIS — M47816 Spondylosis without myelopathy or radiculopathy, lumbar region: Secondary | ICD-10-CM | POA: Diagnosis not present

## 2020-12-25 DIAGNOSIS — Z9181 History of falling: Secondary | ICD-10-CM | POA: Diagnosis not present

## 2020-12-25 DIAGNOSIS — W19XXXD Unspecified fall, subsequent encounter: Secondary | ICD-10-CM | POA: Diagnosis not present

## 2020-12-25 DIAGNOSIS — G8929 Other chronic pain: Secondary | ICD-10-CM | POA: Diagnosis not present

## 2020-12-25 DIAGNOSIS — K219 Gastro-esophageal reflux disease without esophagitis: Secondary | ICD-10-CM | POA: Diagnosis not present

## 2020-12-25 DIAGNOSIS — K8010 Calculus of gallbladder with chronic cholecystitis without obstruction: Secondary | ICD-10-CM | POA: Diagnosis not present

## 2020-12-25 DIAGNOSIS — H269 Unspecified cataract: Secondary | ICD-10-CM | POA: Diagnosis not present

## 2020-12-25 DIAGNOSIS — N1832 Chronic kidney disease, stage 3b: Secondary | ICD-10-CM | POA: Diagnosis not present

## 2020-12-25 DIAGNOSIS — E785 Hyperlipidemia, unspecified: Secondary | ICD-10-CM | POA: Diagnosis not present

## 2020-12-25 DIAGNOSIS — M1710 Unilateral primary osteoarthritis, unspecified knee: Secondary | ICD-10-CM | POA: Diagnosis not present

## 2020-12-25 DIAGNOSIS — M859 Disorder of bone density and structure, unspecified: Secondary | ICD-10-CM | POA: Diagnosis not present

## 2020-12-25 DIAGNOSIS — J45909 Unspecified asthma, uncomplicated: Secondary | ICD-10-CM | POA: Diagnosis not present

## 2020-12-25 DIAGNOSIS — M545 Low back pain, unspecified: Secondary | ICD-10-CM | POA: Diagnosis not present

## 2020-12-25 NOTE — Telephone Encounter (Signed)
Additional information needed for pt regarding  what type of CA she has? and which knee is affected by OA  We can either call her back or fax [phone]) notes with info  Skin CA of nose noted 04/20/2012; OA bilateral knees 04/20/2012

## 2020-12-31 ENCOUNTER — Other Ambulatory Visit: Payer: Self-pay | Admitting: Family Medicine

## 2021-01-03 DIAGNOSIS — I129 Hypertensive chronic kidney disease with stage 1 through stage 4 chronic kidney disease, or unspecified chronic kidney disease: Secondary | ICD-10-CM | POA: Diagnosis not present

## 2021-01-03 DIAGNOSIS — E785 Hyperlipidemia, unspecified: Secondary | ICD-10-CM | POA: Diagnosis not present

## 2021-01-03 DIAGNOSIS — Z9181 History of falling: Secondary | ICD-10-CM | POA: Diagnosis not present

## 2021-01-03 DIAGNOSIS — K8010 Calculus of gallbladder with chronic cholecystitis without obstruction: Secondary | ICD-10-CM | POA: Diagnosis not present

## 2021-01-03 DIAGNOSIS — K219 Gastro-esophageal reflux disease without esophagitis: Secondary | ICD-10-CM | POA: Diagnosis not present

## 2021-01-03 DIAGNOSIS — N1832 Chronic kidney disease, stage 3b: Secondary | ICD-10-CM | POA: Diagnosis not present

## 2021-01-03 DIAGNOSIS — W19XXXD Unspecified fall, subsequent encounter: Secondary | ICD-10-CM | POA: Diagnosis not present

## 2021-01-03 DIAGNOSIS — M47816 Spondylosis without myelopathy or radiculopathy, lumbar region: Secondary | ICD-10-CM | POA: Diagnosis not present

## 2021-01-03 DIAGNOSIS — M1710 Unilateral primary osteoarthritis, unspecified knee: Secondary | ICD-10-CM | POA: Diagnosis not present

## 2021-01-03 DIAGNOSIS — J45909 Unspecified asthma, uncomplicated: Secondary | ICD-10-CM | POA: Diagnosis not present

## 2021-01-03 DIAGNOSIS — H269 Unspecified cataract: Secondary | ICD-10-CM | POA: Diagnosis not present

## 2021-01-03 DIAGNOSIS — G8929 Other chronic pain: Secondary | ICD-10-CM | POA: Diagnosis not present

## 2021-01-03 DIAGNOSIS — M859 Disorder of bone density and structure, unspecified: Secondary | ICD-10-CM | POA: Diagnosis not present

## 2021-01-03 DIAGNOSIS — M545 Low back pain, unspecified: Secondary | ICD-10-CM | POA: Diagnosis not present

## 2021-01-07 ENCOUNTER — Other Ambulatory Visit: Payer: Self-pay | Admitting: Family Medicine

## 2021-01-07 DIAGNOSIS — M859 Disorder of bone density and structure, unspecified: Secondary | ICD-10-CM | POA: Diagnosis not present

## 2021-01-07 DIAGNOSIS — H269 Unspecified cataract: Secondary | ICD-10-CM | POA: Diagnosis not present

## 2021-01-07 DIAGNOSIS — M545 Low back pain, unspecified: Secondary | ICD-10-CM | POA: Diagnosis not present

## 2021-01-07 DIAGNOSIS — M47816 Spondylosis without myelopathy or radiculopathy, lumbar region: Secondary | ICD-10-CM | POA: Diagnosis not present

## 2021-01-07 DIAGNOSIS — N1832 Chronic kidney disease, stage 3b: Secondary | ICD-10-CM | POA: Diagnosis not present

## 2021-01-07 DIAGNOSIS — W19XXXD Unspecified fall, subsequent encounter: Secondary | ICD-10-CM | POA: Diagnosis not present

## 2021-01-07 DIAGNOSIS — G8929 Other chronic pain: Secondary | ICD-10-CM | POA: Diagnosis not present

## 2021-01-07 DIAGNOSIS — I129 Hypertensive chronic kidney disease with stage 1 through stage 4 chronic kidney disease, or unspecified chronic kidney disease: Secondary | ICD-10-CM | POA: Diagnosis not present

## 2021-01-07 DIAGNOSIS — M1710 Unilateral primary osteoarthritis, unspecified knee: Secondary | ICD-10-CM | POA: Diagnosis not present

## 2021-01-07 DIAGNOSIS — J45909 Unspecified asthma, uncomplicated: Secondary | ICD-10-CM | POA: Diagnosis not present

## 2021-01-07 DIAGNOSIS — E785 Hyperlipidemia, unspecified: Secondary | ICD-10-CM | POA: Diagnosis not present

## 2021-01-07 DIAGNOSIS — K219 Gastro-esophageal reflux disease without esophagitis: Secondary | ICD-10-CM | POA: Diagnosis not present

## 2021-01-07 DIAGNOSIS — K8010 Calculus of gallbladder with chronic cholecystitis without obstruction: Secondary | ICD-10-CM | POA: Diagnosis not present

## 2021-01-07 DIAGNOSIS — Z9181 History of falling: Secondary | ICD-10-CM | POA: Diagnosis not present

## 2021-01-14 ENCOUNTER — Telehealth: Payer: Self-pay | Admitting: *Deleted

## 2021-01-14 DIAGNOSIS — W19XXXD Unspecified fall, subsequent encounter: Secondary | ICD-10-CM | POA: Diagnosis not present

## 2021-01-14 DIAGNOSIS — J45909 Unspecified asthma, uncomplicated: Secondary | ICD-10-CM | POA: Diagnosis not present

## 2021-01-14 DIAGNOSIS — Z9181 History of falling: Secondary | ICD-10-CM | POA: Diagnosis not present

## 2021-01-14 DIAGNOSIS — I129 Hypertensive chronic kidney disease with stage 1 through stage 4 chronic kidney disease, or unspecified chronic kidney disease: Secondary | ICD-10-CM | POA: Diagnosis not present

## 2021-01-14 DIAGNOSIS — E785 Hyperlipidemia, unspecified: Secondary | ICD-10-CM | POA: Diagnosis not present

## 2021-01-14 DIAGNOSIS — K8010 Calculus of gallbladder with chronic cholecystitis without obstruction: Secondary | ICD-10-CM | POA: Diagnosis not present

## 2021-01-14 DIAGNOSIS — M859 Disorder of bone density and structure, unspecified: Secondary | ICD-10-CM | POA: Diagnosis not present

## 2021-01-14 DIAGNOSIS — H269 Unspecified cataract: Secondary | ICD-10-CM | POA: Diagnosis not present

## 2021-01-14 DIAGNOSIS — M1710 Unilateral primary osteoarthritis, unspecified knee: Secondary | ICD-10-CM | POA: Diagnosis not present

## 2021-01-14 DIAGNOSIS — M545 Low back pain, unspecified: Secondary | ICD-10-CM | POA: Diagnosis not present

## 2021-01-14 DIAGNOSIS — N1832 Chronic kidney disease, stage 3b: Secondary | ICD-10-CM | POA: Diagnosis not present

## 2021-01-14 DIAGNOSIS — K219 Gastro-esophageal reflux disease without esophagitis: Secondary | ICD-10-CM | POA: Diagnosis not present

## 2021-01-14 DIAGNOSIS — G8929 Other chronic pain: Secondary | ICD-10-CM | POA: Diagnosis not present

## 2021-01-14 DIAGNOSIS — M47816 Spondylosis without myelopathy or radiculopathy, lumbar region: Secondary | ICD-10-CM | POA: Diagnosis not present

## 2021-01-14 NOTE — Telephone Encounter (Signed)
Darnelle Maffucci called to inform that pt has met her goals and is scheduling her for D/C next week and wanted to make Dr. Madilyn Fireman aware.

## 2021-01-20 DIAGNOSIS — Z9181 History of falling: Secondary | ICD-10-CM | POA: Diagnosis not present

## 2021-01-20 DIAGNOSIS — M1710 Unilateral primary osteoarthritis, unspecified knee: Secondary | ICD-10-CM | POA: Diagnosis not present

## 2021-01-20 DIAGNOSIS — I129 Hypertensive chronic kidney disease with stage 1 through stage 4 chronic kidney disease, or unspecified chronic kidney disease: Secondary | ICD-10-CM | POA: Diagnosis not present

## 2021-01-20 DIAGNOSIS — M47816 Spondylosis without myelopathy or radiculopathy, lumbar region: Secondary | ICD-10-CM | POA: Diagnosis not present

## 2021-01-20 DIAGNOSIS — J45909 Unspecified asthma, uncomplicated: Secondary | ICD-10-CM | POA: Diagnosis not present

## 2021-01-20 DIAGNOSIS — N1832 Chronic kidney disease, stage 3b: Secondary | ICD-10-CM | POA: Diagnosis not present

## 2021-01-20 DIAGNOSIS — E785 Hyperlipidemia, unspecified: Secondary | ICD-10-CM | POA: Diagnosis not present

## 2021-01-20 DIAGNOSIS — M859 Disorder of bone density and structure, unspecified: Secondary | ICD-10-CM | POA: Diagnosis not present

## 2021-01-20 DIAGNOSIS — K219 Gastro-esophageal reflux disease without esophagitis: Secondary | ICD-10-CM | POA: Diagnosis not present

## 2021-01-20 DIAGNOSIS — H269 Unspecified cataract: Secondary | ICD-10-CM | POA: Diagnosis not present

## 2021-01-20 DIAGNOSIS — K8010 Calculus of gallbladder with chronic cholecystitis without obstruction: Secondary | ICD-10-CM | POA: Diagnosis not present

## 2021-01-20 DIAGNOSIS — M545 Low back pain, unspecified: Secondary | ICD-10-CM | POA: Diagnosis not present

## 2021-01-20 DIAGNOSIS — W19XXXD Unspecified fall, subsequent encounter: Secondary | ICD-10-CM | POA: Diagnosis not present

## 2021-01-20 DIAGNOSIS — G8929 Other chronic pain: Secondary | ICD-10-CM | POA: Diagnosis not present

## 2021-01-24 ENCOUNTER — Other Ambulatory Visit: Payer: Self-pay | Admitting: Family Medicine

## 2021-01-31 DIAGNOSIS — T699XXA Effect of reduced temperature, unspecified, initial encounter: Secondary | ICD-10-CM | POA: Diagnosis not present

## 2021-01-31 DIAGNOSIS — I10 Essential (primary) hypertension: Secondary | ICD-10-CM | POA: Diagnosis not present

## 2021-01-31 DIAGNOSIS — K5730 Diverticulosis of large intestine without perforation or abscess without bleeding: Secondary | ICD-10-CM | POA: Diagnosis not present

## 2021-01-31 DIAGNOSIS — Z885 Allergy status to narcotic agent status: Secondary | ICD-10-CM | POA: Diagnosis not present

## 2021-01-31 DIAGNOSIS — K449 Diaphragmatic hernia without obstruction or gangrene: Secondary | ICD-10-CM | POA: Diagnosis not present

## 2021-01-31 DIAGNOSIS — N179 Acute kidney failure, unspecified: Secondary | ICD-10-CM | POA: Diagnosis not present

## 2021-01-31 DIAGNOSIS — M6282 Rhabdomyolysis: Secondary | ICD-10-CM | POA: Diagnosis not present

## 2021-01-31 DIAGNOSIS — M25562 Pain in left knee: Secondary | ICD-10-CM | POA: Diagnosis not present

## 2021-01-31 DIAGNOSIS — T148XXA Other injury of unspecified body region, initial encounter: Secondary | ICD-10-CM | POA: Diagnosis not present

## 2021-01-31 DIAGNOSIS — R339 Retention of urine, unspecified: Secondary | ICD-10-CM | POA: Diagnosis not present

## 2021-01-31 DIAGNOSIS — M479 Spondylosis, unspecified: Secondary | ICD-10-CM | POA: Diagnosis not present

## 2021-01-31 DIAGNOSIS — R531 Weakness: Secondary | ICD-10-CM | POA: Diagnosis not present

## 2021-01-31 DIAGNOSIS — N3289 Other specified disorders of bladder: Secondary | ICD-10-CM | POA: Diagnosis not present

## 2021-01-31 DIAGNOSIS — R296 Repeated falls: Secondary | ICD-10-CM | POA: Diagnosis not present

## 2021-01-31 DIAGNOSIS — D649 Anemia, unspecified: Secondary | ICD-10-CM | POA: Diagnosis not present

## 2021-01-31 DIAGNOSIS — Z79899 Other long term (current) drug therapy: Secondary | ICD-10-CM | POA: Diagnosis not present

## 2021-01-31 DIAGNOSIS — M19041 Primary osteoarthritis, right hand: Secondary | ICD-10-CM | POA: Diagnosis not present

## 2021-01-31 DIAGNOSIS — S8992XA Unspecified injury of left lower leg, initial encounter: Secondary | ICD-10-CM | POA: Diagnosis not present

## 2021-01-31 DIAGNOSIS — Z8589 Personal history of malignant neoplasm of other organs and systems: Secondary | ICD-10-CM | POA: Diagnosis not present

## 2021-01-31 DIAGNOSIS — M549 Dorsalgia, unspecified: Secondary | ICD-10-CM | POA: Diagnosis not present

## 2021-01-31 DIAGNOSIS — M19042 Primary osteoarthritis, left hand: Secondary | ICD-10-CM | POA: Diagnosis not present

## 2021-01-31 DIAGNOSIS — R600 Localized edema: Secondary | ICD-10-CM | POA: Diagnosis not present

## 2021-01-31 DIAGNOSIS — D72829 Elevated white blood cell count, unspecified: Secondary | ICD-10-CM | POA: Diagnosis not present

## 2021-01-31 DIAGNOSIS — R9431 Abnormal electrocardiogram [ECG] [EKG]: Secondary | ICD-10-CM | POA: Diagnosis not present

## 2021-01-31 DIAGNOSIS — J929 Pleural plaque without asbestos: Secondary | ICD-10-CM | POA: Diagnosis not present

## 2021-01-31 DIAGNOSIS — S8991XA Unspecified injury of right lower leg, initial encounter: Secondary | ICD-10-CM | POA: Diagnosis not present

## 2021-01-31 DIAGNOSIS — I700 Atherosclerosis of aorta: Secondary | ICD-10-CM | POA: Diagnosis not present

## 2021-01-31 DIAGNOSIS — R918 Other nonspecific abnormal finding of lung field: Secondary | ICD-10-CM | POA: Diagnosis not present

## 2021-01-31 DIAGNOSIS — J986 Disorders of diaphragm: Secondary | ICD-10-CM | POA: Diagnosis not present

## 2021-01-31 DIAGNOSIS — R197 Diarrhea, unspecified: Secondary | ICD-10-CM | POA: Diagnosis not present

## 2021-01-31 DIAGNOSIS — M25561 Pain in right knee: Secondary | ICD-10-CM | POA: Diagnosis not present

## 2021-01-31 DIAGNOSIS — K219 Gastro-esophageal reflux disease without esophagitis: Secondary | ICD-10-CM | POA: Diagnosis not present

## 2021-01-31 DIAGNOSIS — Z743 Need for continuous supervision: Secondary | ICD-10-CM | POA: Diagnosis not present

## 2021-01-31 DIAGNOSIS — M170 Bilateral primary osteoarthritis of knee: Secondary | ICD-10-CM | POA: Diagnosis not present

## 2021-01-31 DIAGNOSIS — E7800 Pure hypercholesterolemia, unspecified: Secondary | ICD-10-CM | POA: Diagnosis not present

## 2021-02-01 DIAGNOSIS — M19041 Primary osteoarthritis, right hand: Secondary | ICD-10-CM | POA: Diagnosis not present

## 2021-02-01 DIAGNOSIS — R339 Retention of urine, unspecified: Secondary | ICD-10-CM | POA: Diagnosis not present

## 2021-02-01 DIAGNOSIS — Z8589 Personal history of malignant neoplasm of other organs and systems: Secondary | ICD-10-CM | POA: Diagnosis not present

## 2021-02-01 DIAGNOSIS — Z79899 Other long term (current) drug therapy: Secondary | ICD-10-CM | POA: Diagnosis not present

## 2021-02-01 DIAGNOSIS — D72829 Elevated white blood cell count, unspecified: Secondary | ICD-10-CM | POA: Diagnosis not present

## 2021-02-01 DIAGNOSIS — T148XXA Other injury of unspecified body region, initial encounter: Secondary | ICD-10-CM | POA: Diagnosis not present

## 2021-02-01 DIAGNOSIS — R9431 Abnormal electrocardiogram [ECG] [EKG]: Secondary | ICD-10-CM | POA: Diagnosis not present

## 2021-02-01 DIAGNOSIS — W19XXXA Unspecified fall, initial encounter: Secondary | ICD-10-CM | POA: Diagnosis not present

## 2021-02-01 DIAGNOSIS — I10 Essential (primary) hypertension: Secondary | ICD-10-CM | POA: Diagnosis not present

## 2021-02-01 DIAGNOSIS — R197 Diarrhea, unspecified: Secondary | ICD-10-CM | POA: Diagnosis not present

## 2021-02-01 DIAGNOSIS — K219 Gastro-esophageal reflux disease without esophagitis: Secondary | ICD-10-CM | POA: Diagnosis not present

## 2021-02-01 DIAGNOSIS — M25562 Pain in left knee: Secondary | ICD-10-CM | POA: Diagnosis not present

## 2021-02-01 DIAGNOSIS — N179 Acute kidney failure, unspecified: Secondary | ICD-10-CM | POA: Diagnosis not present

## 2021-02-01 DIAGNOSIS — E7800 Pure hypercholesterolemia, unspecified: Secondary | ICD-10-CM | POA: Diagnosis not present

## 2021-02-01 DIAGNOSIS — M6282 Rhabdomyolysis: Secondary | ICD-10-CM | POA: Diagnosis not present

## 2021-02-01 DIAGNOSIS — D649 Anemia, unspecified: Secondary | ICD-10-CM | POA: Diagnosis not present

## 2021-02-01 DIAGNOSIS — M479 Spondylosis, unspecified: Secondary | ICD-10-CM | POA: Diagnosis not present

## 2021-02-01 DIAGNOSIS — Z885 Allergy status to narcotic agent status: Secondary | ICD-10-CM | POA: Diagnosis not present

## 2021-02-01 DIAGNOSIS — R296 Repeated falls: Secondary | ICD-10-CM | POA: Diagnosis not present

## 2021-02-01 DIAGNOSIS — M19042 Primary osteoarthritis, left hand: Secondary | ICD-10-CM | POA: Diagnosis not present

## 2021-02-01 DIAGNOSIS — M170 Bilateral primary osteoarthritis of knee: Secondary | ICD-10-CM | POA: Diagnosis not present

## 2021-02-01 DIAGNOSIS — M25561 Pain in right knee: Secondary | ICD-10-CM | POA: Diagnosis not present

## 2021-02-02 DIAGNOSIS — M19041 Primary osteoarthritis, right hand: Secondary | ICD-10-CM | POA: Diagnosis not present

## 2021-02-02 DIAGNOSIS — M25561 Pain in right knee: Secondary | ICD-10-CM | POA: Diagnosis not present

## 2021-02-02 DIAGNOSIS — M25562 Pain in left knee: Secondary | ICD-10-CM | POA: Diagnosis not present

## 2021-02-02 DIAGNOSIS — W19XXXA Unspecified fall, initial encounter: Secondary | ICD-10-CM | POA: Diagnosis not present

## 2021-02-02 DIAGNOSIS — M19042 Primary osteoarthritis, left hand: Secondary | ICD-10-CM | POA: Diagnosis not present

## 2021-02-02 DIAGNOSIS — K219 Gastro-esophageal reflux disease without esophagitis: Secondary | ICD-10-CM | POA: Diagnosis not present

## 2021-02-02 DIAGNOSIS — D72829 Elevated white blood cell count, unspecified: Secondary | ICD-10-CM | POA: Diagnosis not present

## 2021-02-02 DIAGNOSIS — Z885 Allergy status to narcotic agent status: Secondary | ICD-10-CM | POA: Diagnosis not present

## 2021-02-02 DIAGNOSIS — I10 Essential (primary) hypertension: Secondary | ICD-10-CM | POA: Diagnosis not present

## 2021-02-02 DIAGNOSIS — M170 Bilateral primary osteoarthritis of knee: Secondary | ICD-10-CM | POA: Diagnosis not present

## 2021-02-02 DIAGNOSIS — R296 Repeated falls: Secondary | ICD-10-CM | POA: Diagnosis not present

## 2021-02-02 DIAGNOSIS — R9431 Abnormal electrocardiogram [ECG] [EKG]: Secondary | ICD-10-CM | POA: Diagnosis not present

## 2021-02-02 DIAGNOSIS — M479 Spondylosis, unspecified: Secondary | ICD-10-CM | POA: Diagnosis not present

## 2021-02-02 DIAGNOSIS — Z8589 Personal history of malignant neoplasm of other organs and systems: Secondary | ICD-10-CM | POA: Diagnosis not present

## 2021-02-02 DIAGNOSIS — D649 Anemia, unspecified: Secondary | ICD-10-CM | POA: Diagnosis not present

## 2021-02-02 DIAGNOSIS — E7800 Pure hypercholesterolemia, unspecified: Secondary | ICD-10-CM | POA: Diagnosis not present

## 2021-02-02 DIAGNOSIS — R339 Retention of urine, unspecified: Secondary | ICD-10-CM | POA: Diagnosis not present

## 2021-02-02 DIAGNOSIS — Z79899 Other long term (current) drug therapy: Secondary | ICD-10-CM | POA: Diagnosis not present

## 2021-02-02 DIAGNOSIS — T148XXA Other injury of unspecified body region, initial encounter: Secondary | ICD-10-CM | POA: Diagnosis not present

## 2021-02-02 DIAGNOSIS — N179 Acute kidney failure, unspecified: Secondary | ICD-10-CM | POA: Diagnosis not present

## 2021-02-02 DIAGNOSIS — M6282 Rhabdomyolysis: Secondary | ICD-10-CM | POA: Diagnosis not present

## 2021-02-02 DIAGNOSIS — R197 Diarrhea, unspecified: Secondary | ICD-10-CM | POA: Diagnosis not present

## 2021-02-03 DIAGNOSIS — M479 Spondylosis, unspecified: Secondary | ICD-10-CM | POA: Diagnosis not present

## 2021-02-03 DIAGNOSIS — W19XXXA Unspecified fall, initial encounter: Secondary | ICD-10-CM | POA: Diagnosis not present

## 2021-02-03 DIAGNOSIS — D72829 Elevated white blood cell count, unspecified: Secondary | ICD-10-CM | POA: Diagnosis not present

## 2021-02-03 DIAGNOSIS — D649 Anemia, unspecified: Secondary | ICD-10-CM | POA: Diagnosis not present

## 2021-02-03 DIAGNOSIS — I10 Essential (primary) hypertension: Secondary | ICD-10-CM | POA: Diagnosis not present

## 2021-02-03 DIAGNOSIS — M170 Bilateral primary osteoarthritis of knee: Secondary | ICD-10-CM | POA: Diagnosis not present

## 2021-02-03 DIAGNOSIS — N179 Acute kidney failure, unspecified: Secondary | ICD-10-CM | POA: Diagnosis not present

## 2021-02-03 DIAGNOSIS — T148XXA Other injury of unspecified body region, initial encounter: Secondary | ICD-10-CM | POA: Diagnosis not present

## 2021-02-03 DIAGNOSIS — Z79899 Other long term (current) drug therapy: Secondary | ICD-10-CM | POA: Diagnosis not present

## 2021-02-03 DIAGNOSIS — M19042 Primary osteoarthritis, left hand: Secondary | ICD-10-CM | POA: Diagnosis not present

## 2021-02-03 DIAGNOSIS — M25562 Pain in left knee: Secondary | ICD-10-CM | POA: Diagnosis not present

## 2021-02-03 DIAGNOSIS — R296 Repeated falls: Secondary | ICD-10-CM | POA: Diagnosis not present

## 2021-02-03 DIAGNOSIS — I517 Cardiomegaly: Secondary | ICD-10-CM | POA: Diagnosis not present

## 2021-02-03 DIAGNOSIS — I081 Rheumatic disorders of both mitral and tricuspid valves: Secondary | ICD-10-CM | POA: Diagnosis not present

## 2021-02-03 DIAGNOSIS — M25561 Pain in right knee: Secondary | ICD-10-CM | POA: Diagnosis not present

## 2021-02-03 DIAGNOSIS — M19041 Primary osteoarthritis, right hand: Secondary | ICD-10-CM | POA: Diagnosis not present

## 2021-02-03 DIAGNOSIS — R9431 Abnormal electrocardiogram [ECG] [EKG]: Secondary | ICD-10-CM | POA: Diagnosis not present

## 2021-02-03 DIAGNOSIS — K219 Gastro-esophageal reflux disease without esophagitis: Secondary | ICD-10-CM | POA: Diagnosis not present

## 2021-02-03 DIAGNOSIS — R197 Diarrhea, unspecified: Secondary | ICD-10-CM | POA: Diagnosis not present

## 2021-02-03 DIAGNOSIS — R339 Retention of urine, unspecified: Secondary | ICD-10-CM | POA: Diagnosis not present

## 2021-02-03 DIAGNOSIS — E7800 Pure hypercholesterolemia, unspecified: Secondary | ICD-10-CM | POA: Diagnosis not present

## 2021-02-03 DIAGNOSIS — T796XXA Traumatic ischemia of muscle, initial encounter: Secondary | ICD-10-CM | POA: Diagnosis not present

## 2021-02-03 DIAGNOSIS — Z8589 Personal history of malignant neoplasm of other organs and systems: Secondary | ICD-10-CM | POA: Diagnosis not present

## 2021-02-03 DIAGNOSIS — M6282 Rhabdomyolysis: Secondary | ICD-10-CM | POA: Diagnosis not present

## 2021-02-03 DIAGNOSIS — Z885 Allergy status to narcotic agent status: Secondary | ICD-10-CM | POA: Diagnosis not present

## 2021-02-04 ENCOUNTER — Encounter: Payer: Self-pay | Admitting: Family Medicine

## 2021-02-04 DIAGNOSIS — K219 Gastro-esophageal reflux disease without esophagitis: Secondary | ICD-10-CM | POA: Diagnosis not present

## 2021-02-04 DIAGNOSIS — I10 Essential (primary) hypertension: Secondary | ICD-10-CM | POA: Diagnosis not present

## 2021-02-04 DIAGNOSIS — M6282 Rhabdomyolysis: Secondary | ICD-10-CM | POA: Diagnosis not present

## 2021-02-04 DIAGNOSIS — M19042 Primary osteoarthritis, left hand: Secondary | ICD-10-CM | POA: Diagnosis not present

## 2021-02-04 DIAGNOSIS — M25561 Pain in right knee: Secondary | ICD-10-CM | POA: Diagnosis not present

## 2021-02-04 DIAGNOSIS — R197 Diarrhea, unspecified: Secondary | ICD-10-CM | POA: Diagnosis not present

## 2021-02-04 DIAGNOSIS — N179 Acute kidney failure, unspecified: Secondary | ICD-10-CM | POA: Diagnosis not present

## 2021-02-04 DIAGNOSIS — T148XXA Other injury of unspecified body region, initial encounter: Secondary | ICD-10-CM | POA: Diagnosis not present

## 2021-02-04 DIAGNOSIS — M479 Spondylosis, unspecified: Secondary | ICD-10-CM | POA: Diagnosis not present

## 2021-02-04 DIAGNOSIS — R339 Retention of urine, unspecified: Secondary | ICD-10-CM | POA: Diagnosis not present

## 2021-02-04 DIAGNOSIS — W19XXXA Unspecified fall, initial encounter: Secondary | ICD-10-CM | POA: Diagnosis not present

## 2021-02-04 DIAGNOSIS — D72829 Elevated white blood cell count, unspecified: Secondary | ICD-10-CM | POA: Diagnosis not present

## 2021-02-04 DIAGNOSIS — E7800 Pure hypercholesterolemia, unspecified: Secondary | ICD-10-CM | POA: Diagnosis not present

## 2021-02-04 DIAGNOSIS — R9431 Abnormal electrocardiogram [ECG] [EKG]: Secondary | ICD-10-CM | POA: Insufficient documentation

## 2021-02-04 DIAGNOSIS — Z885 Allergy status to narcotic agent status: Secondary | ICD-10-CM | POA: Diagnosis not present

## 2021-02-04 DIAGNOSIS — R296 Repeated falls: Secondary | ICD-10-CM | POA: Diagnosis not present

## 2021-02-04 DIAGNOSIS — Z79899 Other long term (current) drug therapy: Secondary | ICD-10-CM | POA: Diagnosis not present

## 2021-02-04 DIAGNOSIS — M19041 Primary osteoarthritis, right hand: Secondary | ICD-10-CM | POA: Diagnosis not present

## 2021-02-04 DIAGNOSIS — M170 Bilateral primary osteoarthritis of knee: Secondary | ICD-10-CM | POA: Diagnosis not present

## 2021-02-04 DIAGNOSIS — D649 Anemia, unspecified: Secondary | ICD-10-CM | POA: Diagnosis not present

## 2021-02-04 DIAGNOSIS — M25562 Pain in left knee: Secondary | ICD-10-CM | POA: Diagnosis not present

## 2021-02-04 DIAGNOSIS — Z8589 Personal history of malignant neoplasm of other organs and systems: Secondary | ICD-10-CM | POA: Diagnosis not present

## 2021-02-05 DIAGNOSIS — Z8589 Personal history of malignant neoplasm of other organs and systems: Secondary | ICD-10-CM | POA: Diagnosis not present

## 2021-02-05 DIAGNOSIS — M19041 Primary osteoarthritis, right hand: Secondary | ICD-10-CM | POA: Diagnosis not present

## 2021-02-05 DIAGNOSIS — M170 Bilateral primary osteoarthritis of knee: Secondary | ICD-10-CM | POA: Diagnosis not present

## 2021-02-05 DIAGNOSIS — E7800 Pure hypercholesterolemia, unspecified: Secondary | ICD-10-CM | POA: Diagnosis not present

## 2021-02-05 DIAGNOSIS — K219 Gastro-esophageal reflux disease without esophagitis: Secondary | ICD-10-CM | POA: Diagnosis not present

## 2021-02-05 DIAGNOSIS — I10 Essential (primary) hypertension: Secondary | ICD-10-CM | POA: Diagnosis not present

## 2021-02-05 DIAGNOSIS — R197 Diarrhea, unspecified: Secondary | ICD-10-CM | POA: Diagnosis not present

## 2021-02-05 DIAGNOSIS — M19042 Primary osteoarthritis, left hand: Secondary | ICD-10-CM | POA: Diagnosis not present

## 2021-02-05 DIAGNOSIS — R339 Retention of urine, unspecified: Secondary | ICD-10-CM | POA: Diagnosis not present

## 2021-02-05 DIAGNOSIS — W19XXXA Unspecified fall, initial encounter: Secondary | ICD-10-CM | POA: Diagnosis not present

## 2021-02-05 DIAGNOSIS — Z885 Allergy status to narcotic agent status: Secondary | ICD-10-CM | POA: Diagnosis not present

## 2021-02-05 DIAGNOSIS — T148XXA Other injury of unspecified body region, initial encounter: Secondary | ICD-10-CM | POA: Diagnosis not present

## 2021-02-05 DIAGNOSIS — N179 Acute kidney failure, unspecified: Secondary | ICD-10-CM | POA: Diagnosis not present

## 2021-02-05 DIAGNOSIS — D649 Anemia, unspecified: Secondary | ICD-10-CM | POA: Diagnosis not present

## 2021-02-05 DIAGNOSIS — R296 Repeated falls: Secondary | ICD-10-CM | POA: Diagnosis not present

## 2021-02-05 DIAGNOSIS — D72829 Elevated white blood cell count, unspecified: Secondary | ICD-10-CM | POA: Diagnosis not present

## 2021-02-05 DIAGNOSIS — R9431 Abnormal electrocardiogram [ECG] [EKG]: Secondary | ICD-10-CM | POA: Diagnosis not present

## 2021-02-05 DIAGNOSIS — M25562 Pain in left knee: Secondary | ICD-10-CM | POA: Diagnosis not present

## 2021-02-05 DIAGNOSIS — Z79899 Other long term (current) drug therapy: Secondary | ICD-10-CM | POA: Diagnosis not present

## 2021-02-05 DIAGNOSIS — M25561 Pain in right knee: Secondary | ICD-10-CM | POA: Diagnosis not present

## 2021-02-05 DIAGNOSIS — M479 Spondylosis, unspecified: Secondary | ICD-10-CM | POA: Diagnosis not present

## 2021-02-05 DIAGNOSIS — M6282 Rhabdomyolysis: Secondary | ICD-10-CM | POA: Diagnosis not present

## 2021-02-06 DIAGNOSIS — W19XXXA Unspecified fall, initial encounter: Secondary | ICD-10-CM | POA: Diagnosis not present

## 2021-02-06 DIAGNOSIS — Z79899 Other long term (current) drug therapy: Secondary | ICD-10-CM | POA: Diagnosis not present

## 2021-02-06 DIAGNOSIS — R296 Repeated falls: Secondary | ICD-10-CM | POA: Diagnosis not present

## 2021-02-06 DIAGNOSIS — R9431 Abnormal electrocardiogram [ECG] [EKG]: Secondary | ICD-10-CM | POA: Diagnosis not present

## 2021-02-06 DIAGNOSIS — M25562 Pain in left knee: Secondary | ICD-10-CM | POA: Diagnosis not present

## 2021-02-06 DIAGNOSIS — D72829 Elevated white blood cell count, unspecified: Secondary | ICD-10-CM | POA: Diagnosis not present

## 2021-02-06 DIAGNOSIS — K219 Gastro-esophageal reflux disease without esophagitis: Secondary | ICD-10-CM | POA: Diagnosis not present

## 2021-02-06 DIAGNOSIS — T148XXA Other injury of unspecified body region, initial encounter: Secondary | ICD-10-CM | POA: Diagnosis not present

## 2021-02-06 DIAGNOSIS — M19041 Primary osteoarthritis, right hand: Secondary | ICD-10-CM | POA: Diagnosis not present

## 2021-02-06 DIAGNOSIS — R197 Diarrhea, unspecified: Secondary | ICD-10-CM | POA: Diagnosis not present

## 2021-02-06 DIAGNOSIS — N179 Acute kidney failure, unspecified: Secondary | ICD-10-CM | POA: Diagnosis not present

## 2021-02-06 DIAGNOSIS — M19042 Primary osteoarthritis, left hand: Secondary | ICD-10-CM | POA: Diagnosis not present

## 2021-02-06 DIAGNOSIS — M479 Spondylosis, unspecified: Secondary | ICD-10-CM | POA: Diagnosis not present

## 2021-02-06 DIAGNOSIS — I10 Essential (primary) hypertension: Secondary | ICD-10-CM | POA: Diagnosis not present

## 2021-02-06 DIAGNOSIS — M25561 Pain in right knee: Secondary | ICD-10-CM | POA: Diagnosis not present

## 2021-02-06 DIAGNOSIS — E7800 Pure hypercholesterolemia, unspecified: Secondary | ICD-10-CM | POA: Diagnosis not present

## 2021-02-06 DIAGNOSIS — D649 Anemia, unspecified: Secondary | ICD-10-CM | POA: Diagnosis not present

## 2021-02-06 DIAGNOSIS — Z8589 Personal history of malignant neoplasm of other organs and systems: Secondary | ICD-10-CM | POA: Diagnosis not present

## 2021-02-06 DIAGNOSIS — M6282 Rhabdomyolysis: Secondary | ICD-10-CM | POA: Diagnosis not present

## 2021-02-06 DIAGNOSIS — R339 Retention of urine, unspecified: Secondary | ICD-10-CM | POA: Diagnosis not present

## 2021-02-06 DIAGNOSIS — Z885 Allergy status to narcotic agent status: Secondary | ICD-10-CM | POA: Diagnosis not present

## 2021-02-06 DIAGNOSIS — M170 Bilateral primary osteoarthritis of knee: Secondary | ICD-10-CM | POA: Diagnosis not present

## 2021-02-07 DIAGNOSIS — N179 Acute kidney failure, unspecified: Secondary | ICD-10-CM | POA: Diagnosis not present

## 2021-02-07 DIAGNOSIS — M6282 Rhabdomyolysis: Secondary | ICD-10-CM | POA: Diagnosis not present

## 2021-02-07 DIAGNOSIS — R339 Retention of urine, unspecified: Secondary | ICD-10-CM | POA: Diagnosis not present

## 2021-02-07 DIAGNOSIS — M25561 Pain in right knee: Secondary | ICD-10-CM | POA: Diagnosis not present

## 2021-02-07 DIAGNOSIS — K219 Gastro-esophageal reflux disease without esophagitis: Secondary | ICD-10-CM | POA: Diagnosis not present

## 2021-02-07 DIAGNOSIS — D649 Anemia, unspecified: Secondary | ICD-10-CM | POA: Diagnosis not present

## 2021-02-07 DIAGNOSIS — R9431 Abnormal electrocardiogram [ECG] [EKG]: Secondary | ICD-10-CM | POA: Diagnosis not present

## 2021-02-07 DIAGNOSIS — M25562 Pain in left knee: Secondary | ICD-10-CM | POA: Diagnosis not present

## 2021-02-07 DIAGNOSIS — M170 Bilateral primary osteoarthritis of knee: Secondary | ICD-10-CM | POA: Diagnosis not present

## 2021-02-07 DIAGNOSIS — Z79899 Other long term (current) drug therapy: Secondary | ICD-10-CM | POA: Diagnosis not present

## 2021-02-07 DIAGNOSIS — D72829 Elevated white blood cell count, unspecified: Secondary | ICD-10-CM | POA: Diagnosis not present

## 2021-02-07 DIAGNOSIS — M19042 Primary osteoarthritis, left hand: Secondary | ICD-10-CM | POA: Diagnosis not present

## 2021-02-07 DIAGNOSIS — M19041 Primary osteoarthritis, right hand: Secondary | ICD-10-CM | POA: Diagnosis not present

## 2021-02-07 DIAGNOSIS — R296 Repeated falls: Secondary | ICD-10-CM | POA: Diagnosis not present

## 2021-02-07 DIAGNOSIS — T148XXA Other injury of unspecified body region, initial encounter: Secondary | ICD-10-CM | POA: Diagnosis not present

## 2021-02-07 DIAGNOSIS — R197 Diarrhea, unspecified: Secondary | ICD-10-CM | POA: Diagnosis not present

## 2021-02-07 DIAGNOSIS — E7800 Pure hypercholesterolemia, unspecified: Secondary | ICD-10-CM | POA: Diagnosis not present

## 2021-02-07 DIAGNOSIS — I10 Essential (primary) hypertension: Secondary | ICD-10-CM | POA: Diagnosis not present

## 2021-02-07 DIAGNOSIS — W19XXXA Unspecified fall, initial encounter: Secondary | ICD-10-CM | POA: Diagnosis not present

## 2021-02-07 DIAGNOSIS — M479 Spondylosis, unspecified: Secondary | ICD-10-CM | POA: Diagnosis not present

## 2021-02-07 DIAGNOSIS — Z885 Allergy status to narcotic agent status: Secondary | ICD-10-CM | POA: Diagnosis not present

## 2021-02-07 DIAGNOSIS — Z8589 Personal history of malignant neoplasm of other organs and systems: Secondary | ICD-10-CM | POA: Diagnosis not present

## 2021-02-08 DIAGNOSIS — M25562 Pain in left knee: Secondary | ICD-10-CM | POA: Diagnosis not present

## 2021-02-08 DIAGNOSIS — Z79899 Other long term (current) drug therapy: Secondary | ICD-10-CM | POA: Diagnosis not present

## 2021-02-08 DIAGNOSIS — W19XXXA Unspecified fall, initial encounter: Secondary | ICD-10-CM | POA: Diagnosis not present

## 2021-02-08 DIAGNOSIS — Z8589 Personal history of malignant neoplasm of other organs and systems: Secondary | ICD-10-CM | POA: Diagnosis not present

## 2021-02-08 DIAGNOSIS — M25561 Pain in right knee: Secondary | ICD-10-CM | POA: Diagnosis not present

## 2021-02-08 DIAGNOSIS — K219 Gastro-esophageal reflux disease without esophagitis: Secondary | ICD-10-CM | POA: Diagnosis not present

## 2021-02-08 DIAGNOSIS — M6282 Rhabdomyolysis: Secondary | ICD-10-CM | POA: Diagnosis not present

## 2021-02-08 DIAGNOSIS — Z885 Allergy status to narcotic agent status: Secondary | ICD-10-CM | POA: Diagnosis not present

## 2021-02-08 DIAGNOSIS — M19041 Primary osteoarthritis, right hand: Secondary | ICD-10-CM | POA: Diagnosis not present

## 2021-02-08 DIAGNOSIS — D649 Anemia, unspecified: Secondary | ICD-10-CM | POA: Diagnosis not present

## 2021-02-08 DIAGNOSIS — D72829 Elevated white blood cell count, unspecified: Secondary | ICD-10-CM | POA: Diagnosis not present

## 2021-02-08 DIAGNOSIS — R9431 Abnormal electrocardiogram [ECG] [EKG]: Secondary | ICD-10-CM | POA: Diagnosis not present

## 2021-02-08 DIAGNOSIS — E7800 Pure hypercholesterolemia, unspecified: Secondary | ICD-10-CM | POA: Diagnosis not present

## 2021-02-08 DIAGNOSIS — I10 Essential (primary) hypertension: Secondary | ICD-10-CM | POA: Diagnosis not present

## 2021-02-08 DIAGNOSIS — R197 Diarrhea, unspecified: Secondary | ICD-10-CM | POA: Diagnosis not present

## 2021-02-08 DIAGNOSIS — M170 Bilateral primary osteoarthritis of knee: Secondary | ICD-10-CM | POA: Diagnosis not present

## 2021-02-08 DIAGNOSIS — M479 Spondylosis, unspecified: Secondary | ICD-10-CM | POA: Diagnosis not present

## 2021-02-08 DIAGNOSIS — N179 Acute kidney failure, unspecified: Secondary | ICD-10-CM | POA: Diagnosis not present

## 2021-02-08 DIAGNOSIS — R339 Retention of urine, unspecified: Secondary | ICD-10-CM | POA: Diagnosis not present

## 2021-02-08 DIAGNOSIS — R296 Repeated falls: Secondary | ICD-10-CM | POA: Diagnosis not present

## 2021-02-08 DIAGNOSIS — T148XXA Other injury of unspecified body region, initial encounter: Secondary | ICD-10-CM | POA: Diagnosis not present

## 2021-02-08 DIAGNOSIS — M19042 Primary osteoarthritis, left hand: Secondary | ICD-10-CM | POA: Diagnosis not present

## 2021-02-09 DIAGNOSIS — Z885 Allergy status to narcotic agent status: Secondary | ICD-10-CM | POA: Diagnosis not present

## 2021-02-09 DIAGNOSIS — M19041 Primary osteoarthritis, right hand: Secondary | ICD-10-CM | POA: Diagnosis not present

## 2021-02-09 DIAGNOSIS — T148XXA Other injury of unspecified body region, initial encounter: Secondary | ICD-10-CM | POA: Diagnosis not present

## 2021-02-09 DIAGNOSIS — I10 Essential (primary) hypertension: Secondary | ICD-10-CM | POA: Diagnosis not present

## 2021-02-09 DIAGNOSIS — R9431 Abnormal electrocardiogram [ECG] [EKG]: Secondary | ICD-10-CM | POA: Diagnosis not present

## 2021-02-09 DIAGNOSIS — D72829 Elevated white blood cell count, unspecified: Secondary | ICD-10-CM | POA: Diagnosis not present

## 2021-02-09 DIAGNOSIS — M6282 Rhabdomyolysis: Secondary | ICD-10-CM | POA: Diagnosis not present

## 2021-02-09 DIAGNOSIS — M25562 Pain in left knee: Secondary | ICD-10-CM | POA: Diagnosis not present

## 2021-02-09 DIAGNOSIS — K219 Gastro-esophageal reflux disease without esophagitis: Secondary | ICD-10-CM | POA: Diagnosis not present

## 2021-02-09 DIAGNOSIS — Z79899 Other long term (current) drug therapy: Secondary | ICD-10-CM | POA: Diagnosis not present

## 2021-02-09 DIAGNOSIS — M19042 Primary osteoarthritis, left hand: Secondary | ICD-10-CM | POA: Diagnosis not present

## 2021-02-09 DIAGNOSIS — R197 Diarrhea, unspecified: Secondary | ICD-10-CM | POA: Diagnosis not present

## 2021-02-09 DIAGNOSIS — M25561 Pain in right knee: Secondary | ICD-10-CM | POA: Diagnosis not present

## 2021-02-09 DIAGNOSIS — D649 Anemia, unspecified: Secondary | ICD-10-CM | POA: Diagnosis not present

## 2021-02-09 DIAGNOSIS — Z8589 Personal history of malignant neoplasm of other organs and systems: Secondary | ICD-10-CM | POA: Diagnosis not present

## 2021-02-09 DIAGNOSIS — W19XXXA Unspecified fall, initial encounter: Secondary | ICD-10-CM | POA: Diagnosis not present

## 2021-02-09 DIAGNOSIS — M170 Bilateral primary osteoarthritis of knee: Secondary | ICD-10-CM | POA: Diagnosis not present

## 2021-02-09 DIAGNOSIS — R339 Retention of urine, unspecified: Secondary | ICD-10-CM | POA: Diagnosis not present

## 2021-02-09 DIAGNOSIS — M479 Spondylosis, unspecified: Secondary | ICD-10-CM | POA: Diagnosis not present

## 2021-02-09 DIAGNOSIS — N179 Acute kidney failure, unspecified: Secondary | ICD-10-CM | POA: Diagnosis not present

## 2021-02-09 DIAGNOSIS — E7800 Pure hypercholesterolemia, unspecified: Secondary | ICD-10-CM | POA: Diagnosis not present

## 2021-02-09 DIAGNOSIS — R296 Repeated falls: Secondary | ICD-10-CM | POA: Diagnosis not present

## 2021-02-10 DIAGNOSIS — M19042 Primary osteoarthritis, left hand: Secondary | ICD-10-CM | POA: Diagnosis not present

## 2021-02-10 DIAGNOSIS — M25561 Pain in right knee: Secondary | ICD-10-CM | POA: Diagnosis not present

## 2021-02-10 DIAGNOSIS — K219 Gastro-esophageal reflux disease without esophagitis: Secondary | ICD-10-CM | POA: Diagnosis not present

## 2021-02-10 DIAGNOSIS — D649 Anemia, unspecified: Secondary | ICD-10-CM | POA: Diagnosis not present

## 2021-02-10 DIAGNOSIS — I10 Essential (primary) hypertension: Secondary | ICD-10-CM | POA: Diagnosis not present

## 2021-02-10 DIAGNOSIS — R339 Retention of urine, unspecified: Secondary | ICD-10-CM | POA: Diagnosis not present

## 2021-02-10 DIAGNOSIS — W19XXXA Unspecified fall, initial encounter: Secondary | ICD-10-CM | POA: Diagnosis not present

## 2021-02-10 DIAGNOSIS — E7800 Pure hypercholesterolemia, unspecified: Secondary | ICD-10-CM | POA: Diagnosis not present

## 2021-02-10 DIAGNOSIS — R9431 Abnormal electrocardiogram [ECG] [EKG]: Secondary | ICD-10-CM | POA: Diagnosis not present

## 2021-02-10 DIAGNOSIS — R197 Diarrhea, unspecified: Secondary | ICD-10-CM | POA: Diagnosis not present

## 2021-02-10 DIAGNOSIS — Z8589 Personal history of malignant neoplasm of other organs and systems: Secondary | ICD-10-CM | POA: Diagnosis not present

## 2021-02-10 DIAGNOSIS — M19041 Primary osteoarthritis, right hand: Secondary | ICD-10-CM | POA: Diagnosis not present

## 2021-02-10 DIAGNOSIS — M25562 Pain in left knee: Secondary | ICD-10-CM | POA: Diagnosis not present

## 2021-02-10 DIAGNOSIS — N179 Acute kidney failure, unspecified: Secondary | ICD-10-CM | POA: Diagnosis not present

## 2021-02-10 DIAGNOSIS — M479 Spondylosis, unspecified: Secondary | ICD-10-CM | POA: Diagnosis not present

## 2021-02-10 DIAGNOSIS — Z79899 Other long term (current) drug therapy: Secondary | ICD-10-CM | POA: Diagnosis not present

## 2021-02-10 DIAGNOSIS — T148XXA Other injury of unspecified body region, initial encounter: Secondary | ICD-10-CM | POA: Diagnosis not present

## 2021-02-10 DIAGNOSIS — R296 Repeated falls: Secondary | ICD-10-CM | POA: Diagnosis not present

## 2021-02-10 DIAGNOSIS — D72829 Elevated white blood cell count, unspecified: Secondary | ICD-10-CM | POA: Diagnosis not present

## 2021-02-10 DIAGNOSIS — M6282 Rhabdomyolysis: Secondary | ICD-10-CM | POA: Diagnosis not present

## 2021-02-10 DIAGNOSIS — M170 Bilateral primary osteoarthritis of knee: Secondary | ICD-10-CM | POA: Diagnosis not present

## 2021-02-10 DIAGNOSIS — Z885 Allergy status to narcotic agent status: Secondary | ICD-10-CM | POA: Diagnosis not present

## 2021-05-21 ENCOUNTER — Other Ambulatory Visit: Payer: Self-pay

## 2021-05-21 ENCOUNTER — Ambulatory Visit (INDEPENDENT_AMBULATORY_CARE_PROVIDER_SITE_OTHER): Payer: Medicare Other

## 2021-05-21 ENCOUNTER — Ambulatory Visit (INDEPENDENT_AMBULATORY_CARE_PROVIDER_SITE_OTHER): Payer: Medicare Other | Admitting: Family Medicine

## 2021-05-21 ENCOUNTER — Encounter: Payer: Self-pay | Admitting: Family Medicine

## 2021-05-21 VITALS — BP 145/74 | HR 64 | Ht 63.0 in | Wt 142.0 lb

## 2021-05-21 DIAGNOSIS — M546 Pain in thoracic spine: Secondary | ICD-10-CM | POA: Diagnosis not present

## 2021-05-21 DIAGNOSIS — M549 Dorsalgia, unspecified: Secondary | ICD-10-CM | POA: Diagnosis not present

## 2021-05-21 DIAGNOSIS — N179 Acute kidney failure, unspecified: Secondary | ICD-10-CM

## 2021-05-21 DIAGNOSIS — R296 Repeated falls: Secondary | ICD-10-CM | POA: Diagnosis not present

## 2021-05-21 DIAGNOSIS — M545 Low back pain, unspecified: Secondary | ICD-10-CM | POA: Diagnosis not present

## 2021-05-21 DIAGNOSIS — E611 Iron deficiency: Secondary | ICD-10-CM

## 2021-05-21 NOTE — Progress Notes (Signed)
? ?Established Patient Office Visit ? ?Subjective:  ?Patient ID: Katherine Cohen, female    DOB: [birth year]  Age: 81 y.o. MRN: [id number] ? ?CC:  ?Chief Complaint  ?Patient presents with  ? Fall  ?   ?  ? Back Pain  ? ? ?HPI ?Katherine Cohen presents for falls and back pain.  Was actually seen in the emergency department on December 9 for a fall.  At the time she had evidently fallen at home 3 days prior after losing her balance and says she was unable to get back up she was finally contacted by her son.  At that point she was experiencing rhabdomyolysis resulting in acute kidney injury.  Also fell again last week at home.  She said she had a plate in her hand and was walking to the table.  And stumbled.  She said she did not feel weak or dizzy or lightheaded.  She ended up calling the fire department to help her up but did not go seek additional care at that time.  She does not think she injured anything.  She says when she has fallen before she just feels like she trips or stumbles she does not necessarily feel weak or feels like her knee gives out etc.  Denies feeling dizzy or lightheaded when she falls. ? ?She says she uses a cane most of the time she does have a walker at home and occasionally will use it but not consistently.  Reports a history of scoliosis. ? ?She has been having mid left-sided back pain for quite some time.  She says its best when she is resting or sitting it is worse if she tries to do a lot of bending or changing the sheets on the bed.  It does not keep her awake at night.  She is concerned and would like to get x-rays today. ? ?Note, her chlorthalidone was discontinued at that ED visit because of electrolyte abnormality kidney injury and dehydration.  According to her she is back on the medication. ? ?Past Medical History:  ?Diagnosis Date  ? Asthma   ? childhood asthma  ? 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 Cataract   ? Edema   ? leg  ? GERD (gastroesophageal reflux disease)    ? Hyperlipidemia   ? Hypertension   ? OA (osteoarthritis) of knee   ? ? ?Past Surgical History:  ?Procedure Laterality Date  ? CHOLECYSTECTOMY  05/22/2011  ? Procedure: LAPAROSCOPIC CHOLECYSTECTOMY WITH INTRAOPERATIVE CHOLANGIOGRAM;  Surgeon: Imogene Burn. Georgette Dover, MD;  Location: [facility];  Service: General;  Laterality: N/A;  ? HERNIA REPAIR    ? left  inguinal hernia repair   ? NECK DISSECTION    ? L  ? OTHER SURGICAL HISTORY    ? right ankle ulcerated wound I and D   ? VENTRAL HERNIA REPAIR  05/22/2011  ? Procedure: HERNIA REPAIR VENTRAL ADULT;  Surgeon: Imogene Burn. Georgette Dover, MD;  Location: [facility];  Service: General;  Laterality: N/A;  ? ? ?Family History  ?Problem Relation Age of Onset  ? Stroke Mother   ? Heart disease Father   ?     AMI  ? Lung cancer Brother   ?     lung  ? ? ?Social History  ? ?Socioeconomic History  ? Marital status: Widowed  ?  Spouse name: Not on file  ? Number of children: Not on file  ? Years of education: Not on file  ?  Highest education level: Not on file  ?Occupational History  ? Not on file  ?Tobacco Use  ? Smoking status: Never  ? Smokeless tobacco: Never  ?Substance and Sexual Activity  ? Alcohol use: Yes  ?  Alcohol/week: 7.0 standard drinks  ?  Types: 7 Glasses of wine per week  ?  Comment: red wine   ? Drug use: No  ?  Types: Hydromorphone  ? Sexual activity: Not on file  ?Other Topics Concern  ? Not on file  ?Social History Narrative  ? Not on file  ? ?Social Determinants of Health  ? ?Financial Resource Strain: Low Risk   ? Difficulty of Paying Living Expenses: Not hard at all  ?Food Insecurity: No Food Insecurity  ? Worried About Charity fundraiser in the Last Year: Never true  ? Ran Out of Food in the Last Year: Never true  ?Transportation Needs: No Transportation Needs  ? Lack of Transportation (Medical): No  ? Lack of Transportation (Non-Medical): No  ?Physical Activity: Inactive  ? Days of Exercise per Week: 0 days  ? Minutes of Exercise per Session: 0 min  ?Stress: No Stress  Concern Present  ? Feeling of Stress : Not at all  ?Social Connections: Moderately Isolated  ? Frequency of Communication with Friends and Family: More than three times a week  ? Frequency of Social Gatherings with Friends and Family: Once a week  ? Attends Religious Services: 1 to 4 times per year  ? Active Member of Clubs or Organizations: No  ? Attends Archivist Meetings: Never  ? Marital Status: Widowed  ?Intimate Partner Violence: Not At Risk  ? Fear of Current or Ex-Partner: No  ? Emotionally Abused: No  ? Physically Abused: No  ? Sexually Abused: No  ? ? ?Outpatient Medications Prior to Visit  ?Medication Sig Dispense Refill  ? AMBULATORY NON FORMULARY MEDICATION Medication Name: *Cane.  DX: Lower extremity weakness 1 Units 0  ? chlorthalidone (HYGROTON) 25 MG tablet TAKE 1 TABLET BY MOUTH  DAILY 90 tablet 3  ? diclofenac (VOLTAREN) 75 MG EC tablet TAKE 1 TABLET BY MOUTH  TWICE DAILY AS NEEDED 180 tablet 3  ? DULoxetine (CYMBALTA) 30 MG capsule Take 1 capsule (30 mg total) by mouth daily. 90 capsule 3  ? lisinopril (ZESTRIL) 40 MG tablet TAKE 1 TABLET BY MOUTH  DAILY 90 tablet 3  ? metoprolol tartrate (LOPRESSOR) 50 MG tablet TAKE 1 TABLET BY MOUTH  TWICE DAILY 180 tablet 1  ? omeprazole (PRILOSEC) 40 MG capsule TAKE 1 CAPSULE BY MOUTH  DAILY 90 capsule 3  ? pravastatin (PRAVACHOL) 20 MG tablet TAKE 1 TABLET BY MOUTH AT  BEDTIME NEED LAB WORK FOR  FUTURE REFILLS 90 tablet 3  ? Turmeric Curcumin 500 MG CAPS Take 500 mg by mouth 2 (two) times daily.    ? ?No facility-administered medications prior to visit.  ? ? ?Allergies  ?Allergen Reactions  ? Tramadol Nausea Only  ? ? ?ROS ?Review of Systems ? ?  ?Objective:  ?  ?Physical Exam ?Vitals reviewed.  ?Constitutional:   ?   Appearance: Normal appearance. She is well-developed.  ?HENT:  ?   Head: Normocephalic and atraumatic.  ?Eyes:  ?   Conjunctiva/sclera: Conjunctivae normal.  ?Cardiovascular:  ?   Rate and Rhythm: Normal rate and regular rhythm.  ?    Heart sounds: Normal heart sounds.  ?Pulmonary:  ?   Effort: Pulmonary effort is normal.  ?   Breath  sounds: Normal breath sounds.  ?Skin: ?   General: Skin is warm and dry.  ?Neurological:  ?   Mental Status: She is alert and oriented to person, place, and time.  ?Psychiatric:     ?   Behavior: Behavior normal.  ? ? ?BP (!) 145/74   Pulse 64   Ht _0  (1.6 m)   Wt 142 lb (64.4 kg)   SpO2 97%   BMI 25.15 kg/m?  ?Wt Readings from Last 3 Encounters:  ?05/21/21 142 lb (64.4 kg)  ?12/10/20 144 lb (65.3 kg)  ?06/24/20 141 lb (64 kg)  ? ? ? ?There are no preventive care reminders to display for this patient. ? ? ?There are no preventive care reminders to display for this patient. ? ?Lab Results  ?Component Value Date  ? TSH 2.746 08/21/2009  ? ?Lab Results  ?Component Value Date  ? WBC 9.4 12/10/2020  ? HGB 8.9 (L) 12/10/2020  ? HCT 32.9 (L) 12/10/2020  ? MCV 71.5 (L) 12/10/2020  ? PLT 390 12/10/2020  ? ?Lab Results  ?Component Value Date  ? NA 134 (L) 12/10/2020  ? K 4.6 12/10/2020  ? CO2 29 12/10/2020  ? GLUCOSE 96 12/10/2020  ? BUN 35 (H) 12/10/2020  ? CREATININE 1.14 (H) 12/10/2020  ? BILITOT 0.5 12/10/2020  ? ALKPHOS 70 05/04/2016  ? AST 11 12/10/2020  ? ALT 7 12/10/2020  ? PROT 7.0 12/10/2020  ? ALBUMIN 4.4 05/04/2016  ? CALCIUM 9.5 12/10/2020  ? EGFR 49 (L) 12/10/2020  ? ?Lab Results  ?Component Value Date  ? CHOL 146 12/10/2020  ? ?Lab Results  ?Component Value Date  ? HDL 59 12/10/2020  ? ?Lab Results  ?Component Value Date  ? [place] 67 12/10/2020  ? ?Lab Results  ?Component Value Date  ? TRIG 116 12/10/2020  ? ?Lab Results  ?Component Value Date  ? CHOLHDL 2.5 12/10/2020  ? ?No results found for: HGBA1C ? ?  ?Assessment & Plan:  ? ?Problem List Items Addressed This Visit   ? ?  ? Other  ? Frequent falls  ?  She is already had someone come out to the home to do an evaluation previously.  She is not currently interested in physical therapy she says she will not do the extra exercises and homework anyway so  she feels like it would be a waste of time. ?  ?  ? ?Other Visit Diagnoses   ? ? Mid back pain    -  Primary  ? Relevant Orders  ? DG Thoracic Spine W/Swimmers  ? DG Lumbar Spine Complete  ? Iron deficiency

## 2021-05-21 NOTE — Assessment & Plan Note (Signed)
She is already had someone come out to the home to do an evaluation previously.  She is not currently interested in physical therapy she says she will not do the extra exercises and homework anyway so she feels like it would be a waste of time. ?

## 2021-05-22 LAB — IRON,TIBC AND FERRITIN PANEL
%SAT: 9 % (calc) — ABNORMAL LOW (ref 16–45)
Ferritin: 8 ng/mL — ABNORMAL LOW (ref 16–288)
Iron: 42 ug/dL — ABNORMAL LOW (ref 45–160)
TIBC: 459 mcg/dL (calc) — ABNORMAL HIGH (ref 250–450)

## 2021-05-22 LAB — HEMOGLOBIN: Hemoglobin: 11.2 g/dL — ABNORMAL LOW (ref 11.7–15.5)

## 2021-05-22 NOTE — Progress Notes (Signed)
HI [place],  ?Your hemoglobin is looking much better it is up to 11.2 which is great but your iron still low.  Are you taking an iron supplement?

## 2021-05-23 NOTE — Progress Notes (Signed)
Call pt: she has severe curvature of the spine in her low back.  She also has some arthritis at L1-2 and L2-3.  No fracture which is good.  See result for thoracic film too.

## 2021-05-23 NOTE — Progress Notes (Signed)
Call pt:no acute finding like compression fracture etc. I would like to refer her to PT to see if helps. Or we can try home PT via home health, or we can mail her exercises for her low back. If not improvnig then we cna refer to sports med or ortho ? ?See note on lumbar film.

## 2021-05-30 MED ORDER — CELECOXIB 200 MG PO CAPS: 200.0000 mg | ORAL_CAPSULE | Freq: Two times a day (BID) | ORAL | 1 refills | Status: DC | PRN

## 2021-05-30 NOTE — Progress Notes (Signed)
Rx sent for celebrex.

## 2021-06-18 ENCOUNTER — Other Ambulatory Visit: Payer: Self-pay | Admitting: Family Medicine

## 2021-06-26 ENCOUNTER — Other Ambulatory Visit: Payer: Self-pay | Admitting: Family Medicine

## 2021-07-10 ENCOUNTER — Other Ambulatory Visit: Payer: Self-pay | Admitting: Family Medicine

## 2021-07-20 ENCOUNTER — Other Ambulatory Visit: Payer: Self-pay | Admitting: Family Medicine

## 2021-09-08 ENCOUNTER — Other Ambulatory Visit: Payer: Self-pay | Admitting: Family Medicine

## 2021-10-01 ENCOUNTER — Other Ambulatory Visit: Payer: Self-pay | Admitting: Family Medicine

## 2021-11-03 ENCOUNTER — Encounter: Payer: Self-pay | Admitting: Family Medicine

## 2021-11-03 ENCOUNTER — Ambulatory Visit (INDEPENDENT_AMBULATORY_CARE_PROVIDER_SITE_OTHER): Payer: Medicare Other | Admitting: Family Medicine

## 2021-11-03 VITALS — BP 130/78 | HR 49 | Temp 98.0°F | Ht 61.5 in | Wt 139.1 lb

## 2021-11-03 DIAGNOSIS — D509 Iron deficiency anemia, unspecified: Secondary | ICD-10-CM | POA: Diagnosis not present

## 2021-11-03 DIAGNOSIS — E785 Hyperlipidemia, unspecified: Secondary | ICD-10-CM | POA: Diagnosis not present

## 2021-11-03 DIAGNOSIS — J302 Other seasonal allergic rhinitis: Secondary | ICD-10-CM | POA: Diagnosis not present

## 2021-11-03 DIAGNOSIS — I10 Essential (primary) hypertension: Secondary | ICD-10-CM | POA: Diagnosis not present

## 2021-11-03 DIAGNOSIS — K219 Gastro-esophageal reflux disease without esophagitis: Secondary | ICD-10-CM

## 2021-11-03 DIAGNOSIS — R609 Edema, unspecified: Secondary | ICD-10-CM

## 2021-11-03 DIAGNOSIS — H9193 Unspecified hearing loss, bilateral: Secondary | ICD-10-CM | POA: Diagnosis not present

## 2021-11-03 DIAGNOSIS — Z79899 Other long term (current) drug therapy: Secondary | ICD-10-CM | POA: Diagnosis not present

## 2021-11-03 DIAGNOSIS — N1832 Chronic kidney disease, stage 3b: Secondary | ICD-10-CM

## 2021-11-03 DIAGNOSIS — H9190 Unspecified hearing loss, unspecified ear: Secondary | ICD-10-CM | POA: Insufficient documentation

## 2021-11-03 LAB — TSH: TSH: 2.6 u[IU]/mL (ref 0.35–5.50)

## 2021-11-03 LAB — COMPREHENSIVE METABOLIC PANEL
ALT: 8 U/L (ref 0–35)
AST: 12 U/L (ref 0–37)
Albumin: 3.8 g/dL (ref 3.5–5.2)
Alkaline Phosphatase: 54 U/L (ref 39–117)
BUN: 24 mg/dL — ABNORMAL HIGH (ref 6–23)
CO2: 32 mEq/L (ref 19–32)
Calcium: 9.2 mg/dL (ref 8.4–10.5)
Chloride: 96 mEq/L (ref 96–112)
Creatinine, Ser: 0.98 mg/dL (ref 0.40–1.20)
GFR: 54.1 mL/min — ABNORMAL LOW (ref >60.00)
Glucose, Bld: 78 mg/dL (ref 70–99)
Potassium: 4.1 mEq/L (ref 3.5–5.1)
Sodium: 134 mEq/L — ABNORMAL LOW (ref 135–145)
Total Bilirubin: 0.5 mg/dL (ref 0.2–1.2)
Total Protein: 6.6 g/dL (ref 6.0–8.3)

## 2021-11-03 LAB — CBC WITH DIFFERENTIAL/PLATELET
Basophils Absolute: 0.1 10*3/uL (ref 0.0–0.1)
Basophils Relative: 0.8 % (ref 0.0–3.0)
Eosinophils Absolute: 0.1 10*3/uL (ref 0.0–0.7)
Eosinophils Relative: 1.6 % (ref 0.0–5.0)
HCT: 34.4 % — ABNORMAL LOW (ref 36.0–46.0)
Hemoglobin: 11 g/dL — ABNORMAL LOW (ref 12.0–15.0)
Lymphocytes Relative: 7.8 % — ABNORMAL LOW (ref 12.0–46.0)
Lymphs Abs: 0.7 10*3/uL (ref 0.7–4.0)
MCHC: 31.9 g/dL (ref 30.0–36.0)
MCV: 80.3 fl (ref 78.0–100.0)
Monocytes Absolute: 0.8 10*3/uL (ref 0.1–1.0)
Monocytes Relative: 9.1 % (ref 3.0–12.0)
Neutro Abs: 6.8 10*3/uL (ref 1.4–7.7)
Neutrophils Relative %: 80.7 % — ABNORMAL HIGH (ref 43.0–77.0)
Platelets: 301 10*3/uL (ref 150.0–400.0)
RBC: 4.28 Mil/uL (ref 3.87–5.11)
RDW: 17.5 % — ABNORMAL HIGH (ref 11.5–15.5)
WBC: 8.4 10*3/uL (ref 4.0–10.5)

## 2021-11-03 LAB — LIPID PANEL
Cholesterol: 140 mg/dL (ref 0–200)
HDL: 53.5 mg/dL (ref >39.00)
LDL Cholesterol: 66 mg/dL (ref 0–99)
NonHDL: 86.08
Total CHOL/HDL Ratio: 3
Triglycerides: 100 mg/dL (ref 0.0–149.0)
VLDL: 20 mg/dL (ref 0.0–40.0)

## 2021-11-03 LAB — IRON: Iron: 28 ug/dL — ABNORMAL LOW (ref 42–145)

## 2021-11-03 LAB — FERRITIN: Ferritin: 5 ng/mL — ABNORMAL LOW (ref 10.0–291.0)

## 2021-11-03 LAB — VITAMIN B12: Vitamin B-12: 279 pg/mL (ref 211–911)

## 2021-11-03 NOTE — Progress Notes (Signed)
Subjective:    Patient ID: Katherine Cohen, female    DOB: 12/22/1940, 81 y.o.   MRN: [id number]  HPI Pt presents to get ext (from Dr Madilyn Fireman) for f/u of HTN and other chronic health problems   Wt Readings from Last 3 Encounters:  11/03/21 139 lb 2 oz (63.1 kg)  05/21/21 142 lb (64.4 kg)  12/10/20 144 lb (65.3 kg)   25.86 kg/m   Shannon's MIL  Had to give up house in [place] to [place]- was falling a lot prior  Has own apt downstairs and Larene Beach lives upstairs  Is moving boxes / 2 house holds moving at same time   Lost her husband 4 y ago   Medical laboratory scientific officer school for 50 years    Hearing loss : did have to get hearing aids late in her career    Back pain  (has scoliosis)  Declines surgery - does not want a rod in back  It hurts to bend over  Is too lazy to do her PT  Takes celebrex bid  Has CKD also -also the arthritis all over    Allergies -worse when pollen is high (worse when a kid)  Had fiber from old sofa-made her sneeze Has new one now -better  Allegra 180 mg     Low iron/fatigue history  ? Cause  Her family says she has a bad diet  Eats sandwiches   Not enough leafy greens Likes summer fruits   She took iron tabs for a while  Made her go to the bathroom more    Some of R fingers are numb   Declines flu shot    HTN bp is stable today  No cp or palpitations or headaches or edema  No side effects to medicines  BP Readings from Last 3 Encounters:  11/03/21 130/78  05/21/21 (!) 145/74  12/10/20 132/72      Lisinopril 40 mg daily  Metoprolol 50 mg bid  Chlorthalidone 25 mg daily - also for ankle swelling Olga Coaster comp stockings also   Pulse Readings from Last 3 Encounters:  11/03/21 (!) 49  05/21/21 64  12/10/20 61   No heart problems   CKD  Lab Results  Component Value Date   CREATININE 1.14 (H) 12/10/2020   BUN 35 (H) 12/10/2020   NA 134 (L) 12/10/2020   K 4.6 12/10/2020   CL 97 (L) 12/10/2020   CO2 29 12/10/2020    GFR 49   Hyperlipidemia Lab Results  Component Value Date   CHOL 146 12/10/2020   HDL 59 12/10/2020   LDLCALC 67 12/10/2020   TRIG 116 12/10/2020   CHOLHDL 2.5 12/10/2020   Pravastatin 20 mg daily   GERD  Omeprazole 40 mg daily Has severe heartburn if she does not take it  Makes her choke    Used to take cymbalta for pain  Made her nauseated so she stopped     Takes celebrex prn  H/o lumbar spondylosis   Remote history of neck cancer  Also skin cancer of nose   Being careful not to fall  Very careful  Uses a cane when out  Hardwood floors  Feels imbalanced when she goes through door ways usually  Makes her anxious  Not as bad in new house   Patient Active Problem List   Diagnosis Date Noted   Iron deficiency anemia 11/03/2021   Hearing loss 11/03/2021   Current use of proton pump inhibitor 11/03/2021   Seasonal allergies  11/03/2021   Abnormal EKG 02/04/2021   Frequent falls 12/10/2020   CKD stage G3b/A1, 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) 06/25/2020   Chronic bilateral low back pain without sciatica 01/03/2019   Hand swelling 01/22/2016   Gastroesophageal reflux disease without esophagitis 03/22/2014   Venous stasis 07/19/2013   History of asthma 01/31/2013   Skin cancer of nose 04/20/2012   OA (osteoarthritis) of knee 04/20/2012   Ventral hernia - right lower quadrant 05/06/2011   Chronic calculus cholecystitis 05/06/2011   Lumbar spondylosis 08/02/2009   FACIAL PARESTHESIA, LEFT 08/02/2009   Hyperlipidemia 09/11/2008   MAMMOGRAM, ABNORMAL, RIGHT 09/11/2008   OSTEOPENIA 09/10/2008   POSTMENOPAUSAL STATUS 08/30/2008   ESSENTIAL HYPERTENSION, BENIGN 02/03/2007   Bilateral knee pain 02/03/2007   Edema 02/03/2007   Past Medical History:  Diagnosis Date   Asthma    childhood asthma   Cancer ([place]) 02/24/1996   L neck , radiation   Cataract    Edema    leg   GERD (gastroesophageal reflux disease)    Hyperlipidemia     Hypertension    OA (osteoarthritis) of knee    Past Surgical History:  Procedure Laterality Date   CHOLECYSTECTOMY  05/22/2011   Procedure: LAPAROSCOPIC CHOLECYSTECTOMY WITH INTRAOPERATIVE CHOLANGIOGRAM;  Surgeon: Imogene Burn. Tsuei, MD;  Location: [facility];  Service: General;  Laterality: N/A;   HERNIA REPAIR     left  inguinal hernia repair    NECK DISSECTION     L   OTHER SURGICAL HISTORY     right ankle ulcerated wound I and D    VENTRAL HERNIA REPAIR  05/22/2011   Procedure: HERNIA REPAIR VENTRAL ADULT;  Surgeon: Imogene Burn. Georgette Dover, MD;  Location: [facility];  Service: General;  Laterality: N/A;   Social History   Tobacco Use   Smoking status: Never   Smokeless tobacco: Never  Substance Use Topics   Alcohol use: Yes    Alcohol/week: 7.0 standard drinks of alcohol    Types: 7 Glasses of wine per week    Comment: red wine    Drug use: No    Types: Hydromorphone   Family History  Problem Relation Age of Onset   Stroke Mother    Heart disease Father        AMI   Lung cancer Brother        lung   Allergies  Allergen Reactions   Tramadol Nausea Only   Current Outpatient Medications on File Prior to Visit  Medication Sig Dispense Refill   AMBULATORY NON FORMULARY MEDICATION Medication Name: *Cane.  DX: Lower extremity weakness 1 Units 0   celecoxib (CELEBREX) 200 MG capsule TAKE 1 CAPSULE BY MOUTH TWICE  DAILY AS NEEDED 120 capsule 1   chlorthalidone (HYGROTON) 25 MG tablet TAKE 1 TABLET BY MOUTH  DAILY 90 tablet 3   fexofenadine (ALLEGRA) 180 MG tablet Take 180 mg by mouth daily.     lisinopril (ZESTRIL) 40 MG tablet TAKE 1 TABLET BY MOUTH  DAILY 90 tablet 3   metoprolol tartrate (LOPRESSOR) 50 MG tablet TAKE 1 TABLET BY MOUTH TWICE  DAILY 180 tablet 3   Multiple Vitamins-Minerals (PRESERVISION AREDS 2 PO) Take 1 Capful by mouth daily.     omeprazole (PRILOSEC) 40 MG capsule TAKE 1 CAPSULE BY MOUTH  DAILY 100 capsule 2   pravastatin (PRAVACHOL) 20 MG tablet TAKE 1 TABLET BY  MOUTH AT  BEDTIME NEED LAB WORK FOR  FUTURE REFILLS 90 tablet 3  Turmeric Curcumin 500 MG CAPS Take 500 mg by mouth 2 (two) times daily.     No current facility-administered medications on file prior to visit.    Review of Systems  Constitutional:  Positive for fatigue. Negative for activity change, appetite change, fever and unexpected weight change.  HENT:  Negative for congestion, ear pain, rhinorrhea, sinus pressure and sore throat.   Eyes:  Negative for pain, redness and visual disturbance.  Respiratory:  Negative for cough, shortness of breath and wheezing.   Cardiovascular:  Negative for chest pain and palpitations.  Gastrointestinal:  Negative for abdominal pain, blood in stool, constipation and diarrhea.  Endocrine: Negative for polydipsia and polyuria.  Genitourinary:  Negative for dysuria, frequency and urgency.  Musculoskeletal:  Positive for arthralgias. Negative for back pain and myalgias.  Skin:  Negative for pallor and rash.  Allergic/Immunologic: Negative for environmental allergies.  Neurological:  Negative for dizziness, syncope and headaches.  Hematological:  Negative for adenopathy. Does not bruise/bleed easily.  Psychiatric/Behavioral:  Negative for decreased concentration and dysphoric mood. The patient is not nervous/anxious.        Objective:   Physical Exam Constitutional:      General: She is not in acute distress.    Appearance: Normal appearance. She is well-developed and normal weight. She is not ill-appearing or diaphoretic.  HENT:     Head: Normocephalic and atraumatic.     Right Ear: Tympanic membrane, ear canal and external ear normal. There is no impacted cerumen.     Left Ear: Tympanic membrane, ear canal and external ear normal. There is no impacted cerumen.     Nose: Nose normal.  Eyes:     General: No scleral icterus.    Conjunctiva/sclera: Conjunctivae normal.     Pupils: Pupils are equal, round, and reactive to light.  Neck:     Thyroid:  No thyromegaly.     Vascular: No carotid bruit or JVD.     Comments: Surgical changes on the L   Cardiovascular:     Rate and Rhythm: Regular rhythm. Bradycardia present.     Heart sounds: Normal heart sounds.     No gallop.  Pulmonary:     Effort: Pulmonary effort is normal. No respiratory distress.     Breath sounds: Normal breath sounds. No stridor. No wheezing, rhonchi or rales.  Abdominal:     General: There is no distension or abdominal bruit.     Palpations: Abdomen is soft. There is no mass.     Tenderness: There is no abdominal tenderness. There is no guarding or rebound.     Hernia: No hernia is present.  Musculoskeletal:     Cervical back: Normal range of motion and neck supple.     Right lower leg: No edema.     Left lower leg: No edema.  Lymphadenopathy:     Cervical: No cervical adenopathy.  Skin:    General: Skin is warm and dry.     Coloration: Skin is not pale.     Findings: No rash.  Neurological:     Mental Status: She is alert.     Coordination: Coordination normal.     Deep Tendon Reflexes: Reflexes are normal and symmetric. Reflexes normal.  Psychiatric:        Mood and Affect: Mood is depressed.        Cognition and Memory: Cognition and memory normal.     Comments: Seems down  Poor eye contact at times  Assessment & Plan:   Problem List Items Addressed This Visit       Cardiovascular and Mediastinum   ESSENTIAL HYPERTENSION, BENIGN - Primary    bp in fair control at this time  BP Readings from Last 1 Encounters:  11/03/21 130/78  No changes needed Most recent labs reviewed  Disc lifstyle change with low sodium diet and exercise  Plan to continue Lisinopril 40 mg daily  Metoprolol 50 mg bid Chlorthalidone 25 mg daily for this and pedal edema  Lab today      Relevant Orders   TSH   Lipid panel   Comprehensive metabolic panel   CBC with Differential/Platelet     Digestive   Gastroesophageal reflux disease without  esophagitis    Taking omeprazole 40 mg daily  Per pt cannot be without it-gets severe heartburn and choking   B12 and renal panel today        Nervous and Auditory   Hearing loss    Struggling more  Has not worn aides in years Ref done to audiology No cerumen impaction on exam      Relevant Orders   Ambulatory referral to Audiology     Genitourinary   CKD stage G3b/A1, 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)    Labs today   HTN On lisinopril Also celebrex bid  And chlorthalidone Also ppi Enc better fluid intake      Relevant Orders   Comprehensive metabolic panel     Other   Current use of proton pump inhibitor    Has some iron def Added iron and b12 to labs due to poss malabsorb  Pt stated she cannot stop her omeprazole 40 mg       Relevant Orders   Iron   Vitamin B12   Edema    Taking chlorthalidone Has venous insuff Suspect also some lymphedema  Wears stockings      Hyperlipidemia    Lipid panel ordered Takes pravastatin 20 mg daily  Enc low trans fat diet       Relevant Orders   Lipid panel   Iron deficiency anemia    Lab today  No h/o GI bleed  Has CKD Also on ppi   No longer taking iron orally      Relevant Orders   CBC with Differential/Platelet   Ferritin   Iron   Seasonal allergies    Taking allegra  Worse seasonally

## 2021-11-03 NOTE — Assessment & Plan Note (Signed)
Lipid panel ordered Takes pravastatin 20 mg daily  Enc low trans fat diet

## 2021-11-03 NOTE — Assessment & Plan Note (Signed)
Has some iron def Added iron and b12 to labs due to poss malabsorb  Pt stated she cannot stop her omeprazole 40 mg

## 2021-11-03 NOTE — Assessment & Plan Note (Signed)
Taking omeprazole 40 mg daily  Per pt cannot be without it-gets severe heartburn and choking   B12 and renal panel today

## 2021-11-03 NOTE — Patient Instructions (Addendum)
I will work on a referral to audiology in [place] (if you change your mind about location we can change the order)  You will get a call   Get some non slip soled shoes/slippers for the house to prevent falls   Labs today   Take care of yourself   If you change your mind about a flu shot let us know

## 2021-11-03 NOTE — Assessment & Plan Note (Signed)
Taking chlorthalidone Has venous insuff Suspect also some lymphedema  Wears stockings

## 2021-11-03 NOTE — Assessment & Plan Note (Addendum)
Labs today   HTN On lisinopril Also celebrex bid  And chlorthalidone Also ppi Enc better fluid intake

## 2021-11-03 NOTE — Assessment & Plan Note (Signed)
Struggling more  Has not worn aides in years Ref done to audiology No cerumen impaction on exam

## 2021-11-03 NOTE — Assessment & Plan Note (Signed)
bp in fair control at this time  BP Readings from Last 1 Encounters:  11/03/21 130/78   No changes needed Most recent labs reviewed  Disc lifstyle change with low sodium diet and exercise  Plan to continue Lisinopril 40 mg daily  Metoprolol 50 mg bid Chlorthalidone 25 mg daily for this and pedal edema  Lab today

## 2021-11-03 NOTE — Assessment & Plan Note (Signed)
Taking allegra  Worse seasonally

## 2021-11-03 NOTE — Assessment & Plan Note (Signed)
Lab today  No h/o GI bleed  Has CKD Also on ppi   No longer taking iron orally

## 2021-11-13 ENCOUNTER — Telehealth: Payer: Self-pay | Admitting: *Deleted

## 2021-11-13 NOTE — Telephone Encounter (Signed)
Iron infusions are sometimes done but unsure if her labs are low enough to qualify her  I will refer to heme to discuss  Please ask if she knows in the past what caused her anemia? (Let me know before I do the referral)  Any abdominal/GI symptoms  Her last colonoscopy in the chart is poor quality/unreadable -please send for it again  Thanks

## 2021-11-13 NOTE — Telephone Encounter (Signed)
Katherine Cohen sent a message saying:  Pt says she cannot tolerate oral iron. Regular iron constipates her and the non-constipating iron causes diarrhea. She has tried several times with different types of iron. She says when she was at [facility] in December, she thinks they gave her a shot for her low iron. Pt is asking if she can get that or refer her to a hematologist in [place] to discuss options.

## 2021-11-14 NOTE — Telephone Encounter (Signed)
Larene Beach sent a message saying:  Pt wants to try taking SlowFe before she does a referral to anywhere. Really just refused a colonoscopy and not real big on the hematologist idea now  She will not accept that she is depressed and on good days she wants help. She did so good Saturday when I took her to [place] and then the grocery store. On bad days, like yesterday, she does not want any help. Just wants to die. She is still in denial that she really needs hearing aids.  (I advised Larene Beach to keep [place] posted if pt changes her mind, Iron added to med list)

## 2021-11-14 NOTE — Telephone Encounter (Signed)
Shannon notified.  °

## 2021-11-14 NOTE — Telephone Encounter (Signed)
She will feel better if we can achieve a decent iron level Of course I cannot help her if she does not accept help  If /when she is ready to talk about depression please follow up  If suicidal of course= go to ER or call [facility]   Please keep me posted   Let's re check labs in 1 mo

## 2021-11-28 ENCOUNTER — Other Ambulatory Visit: Payer: Self-pay | Admitting: *Deleted

## 2021-11-28 NOTE — Telephone Encounter (Signed)
She has chronic kidney disease (last labs were reassuring but we don't want them getting worse) Optimally she should only take NSAID once in a while when absolutely necessary She has celebrex on list already   I would rather use voltaren gel than oral voltaren  Please check in with her and Larene Beach to see what she is taking and how often

## 2021-11-28 NOTE — Telephone Encounter (Signed)
Katherine Cohen notified, she will talk with pt and let us know

## 2021-11-28 NOTE — Telephone Encounter (Signed)
Pt needs a refill of diclofenac, it's not on med list but per Larene Beach pt has not d/c med and still takes it. Last prescribed by last PCP on 01/27/21 with a BID dosing.  [facility]

## 2021-11-30 ENCOUNTER — Other Ambulatory Visit: Payer: Self-pay | Admitting: Family Medicine

## 2021-12-15 ENCOUNTER — Ambulatory Visit (INDEPENDENT_AMBULATORY_CARE_PROVIDER_SITE_OTHER)
Admission: RE | Admit: 2021-12-15 | Discharge: 2021-12-15 | Disposition: A | Discharge status: Home / Self Care | Payer: Medicare Other | Source: Ambulatory Visit | Attending: Family Medicine | Admitting: Family Medicine

## 2021-12-15 ENCOUNTER — Other Ambulatory Visit: Payer: Self-pay | Admitting: Family Medicine

## 2021-12-15 ENCOUNTER — Ambulatory Visit
Admission: RE | Admit: 2021-12-15 | Discharge: 2021-12-15 | Disposition: A | Discharge status: Home / Self Care | Payer: Medicare Other | Source: Ambulatory Visit | Attending: Family Medicine | Admitting: Family Medicine

## 2021-12-15 ENCOUNTER — Ambulatory Visit (INDEPENDENT_AMBULATORY_CARE_PROVIDER_SITE_OTHER): Payer: Medicare Other | Admitting: Family Medicine

## 2021-12-15 VITALS — BP 134/86 | HR 57 | Temp 97.7°F | Ht 61.5 in | Wt 134.1 lb

## 2021-12-15 DIAGNOSIS — M1711 Unilateral primary osteoarthritis, right knee: Secondary | ICD-10-CM | POA: Diagnosis not present

## 2021-12-15 DIAGNOSIS — M170 Bilateral primary osteoarthritis of knee: Secondary | ICD-10-CM

## 2021-12-15 DIAGNOSIS — M1712 Unilateral primary osteoarthritis, left knee: Secondary | ICD-10-CM | POA: Diagnosis not present

## 2021-12-15 MED ORDER — TRIAMCINOLONE ACETONIDE 40 MG/ML IJ SUSP
40.0000 mg | Freq: Once | INTRAMUSCULAR | Status: AC
  Administered 2021-12-15: 40 mg via INTRA_ARTICULAR

## 2021-12-15 MED ORDER — CELECOXIB 100 MG PO CAPS: 100.0000 mg | ORAL_CAPSULE | Freq: Every day | ORAL | 1 refills | Status: DC

## 2021-12-15 NOTE — Progress Notes (Signed)
Macenzie Cohen T. Katherine Gorczyca, MD, Katherine Cohen at [facility] [place], [address]  Phone: [phone]  FAX: [phone]  Katherine Cohen - 81 y.o. female  MRN [id number]  Date of Birth: 1940-06-15  Date: 12/15/2021  PCP: Abner Greenspan, MD  Referral: Abner Greenspan, MD  Chief Complaint  Patient presents with   Knee Pain    Bilateral   Fall    Frequently   Subjective:   Katherine Cohen is a 81 y.o. very pleasant female patient with Body mass index is 24.93 kg/m. who presents with the following:  81 year old patient presents with knee pain.  For years had a knee pain, has taken some Voltaren, and now feels really uncertain walking.   Most recent fall was 3 weeks ago.   She has medial greater than lateral compartmental osteoarthritis.  Having a lot more pain off of NSAIDS -she was taking some what she describes as diclofenac, but this may have actually been Celebrex, not entirely clear based on history.  She has been taking some oral NSAIDs for years according to the patient.  She now has much more pain in her knees is having difficulty standing and doing her basic activities.  I spoke with her primary care doctor and she stopped this due to concerns of potential renal inhibition.  Most recently her GFR was in the 50s.  She was previously seen at a different practice.  She also has had repeated falls.  Additional GFR in December 2022 was all in the 60s.  She did have a peak creatinine of 1.42 or 3 years ago.  This appears to been improved compared to the length of time frame of 3 to 5 years.    B knee injections Start celebrex  Review of Systems is noted in the HPI, as appropriate  Objective:   BP 134/86   Pulse (!) 57   Temp 97.7 F (36.5 C) (Oral)   Ht 5' 1.5" (1.562 m)   Wt 134 lb 2 oz (60.8 kg)   SpO2 96%   BMI 24.93 kg/m   GEN: No acute distress; alert,appropriate. PULM: Breathing comfortably in no respiratory  distress PSYCH: Normally interactive.   Bilateral knee exam: Full extension and flexion to 115.  Stable to varus and valgus stress.  She does have notable patellar crepitus and pain with loading the medial lateral patellar facets.  She does have notable medial joint line tenderness.  Some lateral joint line tenderness to a lesser degree.  Any kind of forced flexion causes pain.  Laboratory and Imaging Data:  Assessment and Plan:     ICD-10-CM   1. Primary osteoarthritis of knees, bilateral  M17.0 triamcinolone acetonide (KENALOG-40) injection 40 mg    triamcinolone acetonide (KENALOG-40) injection 40 mg    CANCELED: DG Knee 4 Views W/Patella Right    CANCELED: DG Knee 4 Views W/Patella Left     She does have tricompartmental osteoarthritis, unfortunately could not get a Lutricia Feil view, and she does have some generalized arthritis, not end-stage.  Think having her on scheduled NSAIDs is entirely reasonable, and low risk in a situation where the patient has chronic knee pain that is inhibiting her ability to move, has an increased risk of falls, and GFR has been 50-60 over the last year.  I discussed potential benefits versus potential risk with the patient's primary care doctor, and I am going to start the patient on scheduled Celebrex 100 mg.  For symptomatic relief, we are going to do bilateral injections today, as well.  Social: Right now her knee pain is limiting her ability to do her activities of daily living, let alone walking.  Aspiration/Injection Procedure Note Katherine Cohen 1940-04-29 Date of procedure: 12/15/2021  Procedure: Large Joint Joint Aspiration / Injection of the Right Knee Indications: Pain  Procedure Details Patient verbally consented to procedure. Risks, benefits, and alternatives explained. Sterilely prepped with Chloraprep. Ethyl cholride used for anesthesia. 9 cc Lidocaine 1% mixed with 1 mL Kenalog 40 mg injected using the anteromedial approach without  difficulty. No complications with procedure and tolerated well. Patient had decreased pain post-injection.  Medication: 1 mL of Kenalog 40 mg  Aspiration/Injection Procedure Note Katherine Cohen Apr 25, 1940 Date of procedure: 12/15/2021  Procedure: Large Joint Aspiration / Injection of the Left Knee Indications: Pain  Procedure Details Patient verbally consented to procedure. Risks, benefits, and alternatives explained. Sterilely prepped with Chloraprep. Ethyl cholride used for anesthesia. 9 cc Lidocaine 1% mixed with 1 mL Kenalog 40 mg injected using the anteromedial approach without difficulty. No complications with procedure and tolerated well. Patient had decreased pain post-injection.  Medication: 1 mL of Kenalog 40 mg   Medication Management during today's office visit: Meds ordered this encounter  Medications   celecoxib (CELEBREX) 100 MG capsule    Sig: Take 1 capsule (100 mg total) by mouth daily.    Dispense:  90 capsule    Refill:  1   triamcinolone acetonide (KENALOG-40) injection 40 mg   triamcinolone acetonide (KENALOG-40) injection 40 mg   Medications Discontinued During This Encounter  Medication Reason   celecoxib (CELEBREX) 200 MG capsule     Orders placed today for conditions managed today: No orders of the defined types were placed in this encounter.   Disposition: No follow-ups on file.  [employer] One speech-to-text software was used for transcription in this dictation.  Possible transcriptional errors can occur using Editor, commissioning.   Signed,  Maud Deed. Carriann Hesse, MD   Outpatient Encounter Medications as of 12/15/2021  Medication Sig   AMBULATORY NON FORMULARY MEDICATION Medication Name: *Cane.  DX: Lower extremity weakness   celecoxib (CELEBREX) 100 MG capsule Take 1 capsule (100 mg total) by mouth daily.   chlorthalidone (HYGROTON) 25 MG tablet TAKE 1 TABLET BY MOUTH  DAILY   Ferrous Sulfate Dried (SLOW IRON PO) Take 1 capsule by mouth daily.    fexofenadine (ALLEGRA) 180 MG tablet Take 180 mg by mouth daily.   lisinopril (ZESTRIL) 40 MG tablet TAKE 1 TABLET BY MOUTH  DAILY   metoprolol tartrate (LOPRESSOR) 50 MG tablet TAKE 1 TABLET BY MOUTH TWICE  DAILY   Multiple Vitamins-Minerals (PRESERVISION AREDS 2 PO) Take 1 Capful by mouth daily.   omeprazole (PRILOSEC) 40 MG capsule TAKE 1 CAPSULE BY MOUTH  DAILY   pravastatin (PRAVACHOL) 20 MG tablet TAKE 1 TABLET BY MOUTH AT  BEDTIME NEED LAB WORK FOR FUTURE REFILLS   Turmeric Curcumin 500 MG CAPS Take 500 mg by mouth 2 (two) times daily.   [DISCONTINUED] celecoxib (CELEBREX) 200 MG capsule TAKE 1 CAPSULE BY MOUTH TWICE  DAILY AS NEEDED   [EXPIRED] triamcinolone acetonide (KENALOG-40) injection 40 mg    [EXPIRED] triamcinolone acetonide (KENALOG-40) injection 40 mg    No facility-administered encounter medications on file as of 12/15/2021.

## 2021-12-16 ENCOUNTER — Encounter: Payer: Self-pay | Admitting: Family Medicine

## 2021-12-27 ENCOUNTER — Other Ambulatory Visit: Payer: Self-pay | Admitting: Family Medicine

## 2022-01-23 ENCOUNTER — Other Ambulatory Visit: Payer: Self-pay | Admitting: *Deleted

## 2022-01-23 MED ORDER — PRAVASTATIN SODIUM 20 MG PO TABS: ORAL_TABLET | ORAL | 1 refills | Status: DC

## 2022-02-26 DIAGNOSIS — H905 Unspecified sensorineural hearing loss: Secondary | ICD-10-CM | POA: Diagnosis not present

## 2022-03-29 ENCOUNTER — Other Ambulatory Visit: Payer: Self-pay | Admitting: Family Medicine

## 2022-05-25 ENCOUNTER — Other Ambulatory Visit: Payer: Self-pay | Admitting: Family Medicine

## 2022-06-13 ENCOUNTER — Other Ambulatory Visit: Payer: Self-pay | Admitting: Family Medicine

## 2022-06-17 ENCOUNTER — Other Ambulatory Visit: Payer: Self-pay | Admitting: Family Medicine

## 2022-06-18 NOTE — Telephone Encounter (Signed)
Francis Dowse (on DPR) notified

## 2022-06-18 NOTE — Telephone Encounter (Signed)
I refilled this once   She is due for a follow up appt please when able

## 2022-06-22 ENCOUNTER — Telehealth: Payer: Self-pay | Admitting: Family Medicine

## 2022-06-22 NOTE — Telephone Encounter (Signed)
Contacted Katherine Cohen to schedule their annual wellness visit. Appointment made for 06/22/2022.  [facility] Care Guide [facility] AWV TEAM Direct Dial: [phone]

## 2022-07-01 NOTE — Telephone Encounter (Signed)
Patient scheduled for follow up.

## 2022-07-01 NOTE — Telephone Encounter (Signed)
Noted  

## 2022-07-14 ENCOUNTER — Encounter: Payer: Self-pay | Admitting: Family Medicine

## 2022-07-14 ENCOUNTER — Ambulatory Visit (INDEPENDENT_AMBULATORY_CARE_PROVIDER_SITE_OTHER): Payer: Medicare Other | Admitting: Family Medicine

## 2022-07-14 VITALS — BP 116/76 | HR 94 | Temp 97.6°F | Ht 61.5 in | Wt 135.2 lb

## 2022-07-14 DIAGNOSIS — R609 Edema, unspecified: Secondary | ICD-10-CM | POA: Diagnosis not present

## 2022-07-14 DIAGNOSIS — R296 Repeated falls: Secondary | ICD-10-CM

## 2022-07-14 DIAGNOSIS — Z79899 Other long term (current) drug therapy: Secondary | ICD-10-CM

## 2022-07-14 DIAGNOSIS — N1832 Chronic kidney disease, stage 3b: Secondary | ICD-10-CM | POA: Diagnosis not present

## 2022-07-14 DIAGNOSIS — K219 Gastro-esophageal reflux disease without esophagitis: Secondary | ICD-10-CM

## 2022-07-14 DIAGNOSIS — M47816 Spondylosis without myelopathy or radiculopathy, lumbar region: Secondary | ICD-10-CM

## 2022-07-14 DIAGNOSIS — E785 Hyperlipidemia, unspecified: Secondary | ICD-10-CM | POA: Diagnosis not present

## 2022-07-14 DIAGNOSIS — M25561 Pain in right knee: Secondary | ICD-10-CM | POA: Diagnosis not present

## 2022-07-14 DIAGNOSIS — G8929 Other chronic pain: Secondary | ICD-10-CM

## 2022-07-14 DIAGNOSIS — D509 Iron deficiency anemia, unspecified: Secondary | ICD-10-CM | POA: Diagnosis not present

## 2022-07-14 DIAGNOSIS — M8588 Other specified disorders of bone density and structure, other site: Secondary | ICD-10-CM | POA: Diagnosis not present

## 2022-07-14 DIAGNOSIS — I10 Essential (primary) hypertension: Secondary | ICD-10-CM | POA: Diagnosis not present

## 2022-07-14 DIAGNOSIS — M25562 Pain in left knee: Secondary | ICD-10-CM | POA: Diagnosis not present

## 2022-07-14 DIAGNOSIS — H9193 Unspecified hearing loss, bilateral: Secondary | ICD-10-CM | POA: Diagnosis not present

## 2022-07-14 LAB — CBC WITH DIFFERENTIAL/PLATELET
Basophils Absolute: 0 10*3/uL (ref 0.0–0.1)
Basophils Relative: 0.8 % (ref 0.0–3.0)
Eosinophils Absolute: 0.1 10*3/uL (ref 0.0–0.7)
Eosinophils Relative: 2.8 % (ref 0.0–5.0)
HCT: 40.6 % (ref 36.0–46.0)
Hemoglobin: 12.9 g/dL (ref 12.0–15.0)
Lymphocytes Relative: 18.4 % (ref 12.0–46.0)
Lymphs Abs: 0.9 10*3/uL (ref 0.7–4.0)
MCHC: 31.8 g/dL (ref 30.0–36.0)
MCV: 89.6 fl (ref 78.0–100.0)
Monocytes Absolute: 0.6 10*3/uL (ref 0.1–1.0)
Monocytes Relative: 11.8 % (ref 3.0–12.0)
Neutro Abs: 3.3 10*3/uL (ref 1.4–7.7)
Neutrophils Relative %: 66.2 % (ref 43.0–77.0)
Platelets: 249 10*3/uL (ref 150.0–400.0)
RBC: 4.53 Mil/uL (ref 3.87–5.11)
RDW: 15.6 % — ABNORMAL HIGH (ref 11.5–15.5)
WBC: 5 10*3/uL (ref 4.0–10.5)

## 2022-07-14 LAB — BASIC METABOLIC PANEL
BUN: 25 mg/dL — ABNORMAL HIGH (ref 6–23)
CO2: 31 mEq/L (ref 19–32)
Calcium: 9.5 mg/dL (ref 8.4–10.5)
Chloride: 97 mEq/L (ref 96–112)
Creatinine, Ser: 0.87 mg/dL (ref 0.40–1.20)
GFR: 62.1 mL/min (ref >60.00)
Glucose, Bld: 92 mg/dL (ref 70–99)
Potassium: 3.7 mEq/L (ref 3.5–5.1)
Sodium: 138 mEq/L (ref 135–145)

## 2022-07-14 LAB — IRON: Iron: 60 ug/dL (ref 42–145)

## 2022-07-14 MED ORDER — OMEPRAZOLE 40 MG PO CPDR: 40.0000 mg | DELAYED_RELEASE_CAPSULE | Freq: Every day | ORAL | 2 refills | Status: DC

## 2022-07-14 NOTE — Assessment & Plan Note (Signed)
Disc goals for lipids and reasons to control them Rev last labs with pt Rev low sat fat diet in detail Taking pravastatin 20 mg dialy  LDL of 66 -well controlled

## 2022-07-14 NOTE — Assessment & Plan Note (Signed)
Pt took iron short term (slow iron) but stopped for loose bowels  In retrospect it may have been tumeric /not iron that caused that  Will check labs today No GI symptoms Has CKD as well   May try her slow iron or something new depending on results

## 2022-07-14 NOTE — Patient Instructions (Addendum)
I do recommend a bone density test for you  Let us know if you want one and I will order it and give you the number to schedule   Lets check labs for kidney function and anemia today   Please start 500 mcg of vitamin B12 every day    If you are interested in the new shingles vaccine (Shingrix) - call your local pharmacy to check on coverage and availability  If affordable, get on a wait list at your pharmacy to get the vaccine.

## 2022-07-14 NOTE — Assessment & Plan Note (Signed)
Encouraged strongly to take B12 500 mcg daily   Lab Results  Component Value Date   VITAMINB12 279 11/03/2021    Also vit D for bone health

## 2022-07-14 NOTE — Assessment & Plan Note (Signed)
Per pt -had some shots (? Epidural)  Not effective  Uses cane and walker In chronic pain

## 2022-07-14 NOTE — Assessment & Plan Note (Signed)
Pt saw audiology  Seldom wears hearing aides because they annoy her

## 2022-07-14 NOTE — Assessment & Plan Note (Signed)
Declines dexa now but may consider later  No new falls (is prone) Encouraged vit D intake with ca if tolerated Exercise is limited due to her pain   Last dexa 2010

## 2022-07-14 NOTE — Assessment & Plan Note (Signed)
No falls since last visit   This is reasurring Uses walker or cane

## 2022-07-14 NOTE — Progress Notes (Signed)
Subjective:    Patient ID: Katherine Cohen, female    DOB: [birth year], 82 y.o.   MRN: [id number]  HPI Pt presents for f/u of HTN and other chronic medical problems including anemia , CKD, hyperlipidemia  Wt Readings from Last 3 Encounters:  07/14/22 135 lb 4 oz (61.3 kg)  12/15/21 134 lb 2 oz (60.8 kg)  11/03/21 139 lb 2 oz (63.1 kg)   25.14 kg/m  Doing all right  No more falling     HTN bp is stable today  No cp or palpitations or headaches or edema  No side effects to medicines  BP Readings from Last 3 Encounters:  07/14/22 116/76  12/15/21 134/86  11/03/21 130/78    Pulse Readings from Last 3 Encounters:  07/14/22 94  12/15/21 (!) 57  11/03/21 (!) 49    Lisinopril 40 mg daily  Metoprolol 50 mg bid Chlorthalidone 25 mg daily for this and pedal edema     Pedal edema with venous insuff  No change in that  Wears her hose all the time   Last metabolic panel Lab Results  Component Value Date   GLUCOSE 78 11/03/2021   NA 134 (L) 11/03/2021   K 4.1 11/03/2021   CL 96 11/03/2021   CO2 32 11/03/2021   BUN 24 (H) 11/03/2021   CREATININE 0.98 11/03/2021   EGFR 49 (L) 12/10/2020   CALCIUM 9.2 11/03/2021   PROT 6.6 11/03/2021   ALBUMIN 3.8 11/03/2021   BILITOT 0.5 11/03/2021   ALKPHOS 54 11/03/2021   AST 12 11/03/2021   ALT 8 11/03/2021  GFR of 54.1   H/o CKD 3ba  Thinks she drinks fair amt of fluids Was told by family not enough   Saw Dr Patsy Lager about knee arthritis   He discussed her renal issues and pros/cons/risks of nsaid Was started on low dose celebrex 100 mg daily  Also injected both knees  She says this did not help at all   She tried some tumeric - did not help  Now takes osteo bi flex- helps a little bit   Had some injections in her back also   Is in a lot of chronic pain   Now living on hard floors is harder  Also using some voltaren gel as needed   Hearing loss  She went to audiology  Got hearing aides  They are annoying and  she does not wear them  Tries to wear them in church   Osteopenia  Last dexa was in 2010 Falls-none new  Fractures-none  Supplements  Exercise    Keeps a walker by her bed    Hyperlipidemia Lab Results  Component Value Date   CHOL 140 11/03/2021   HDL 53.50 11/03/2021   LDLCALC 66 11/03/2021   TRIG 100.0 11/03/2021   CHOLHDL 3 11/03/2021   Pravastatin 20 mg daily    GERD Omeprazole 40 mg daily  She has not been able to tolerate being off it Lab Results  Component Value Date   VITAMINB12 279 11/03/2021   Takes 500 mcg vit B12 daily    Past anemia  Lab Results  Component Value Date   WBC 8.4 11/03/2021   HGB 11.0 (L) 11/03/2021   HCT 34.4 (L) 11/03/2021   MCV 80.3 11/03/2021   PLT 301.0 11/03/2021   Lab Results  Component Value Date   IRON 28 (L) 11/03/2021   TIBC 459 (H) 05/21/2021   FERRITIN 5.0 (L) 11/03/2021    She started  some slow iron / longer than a month  She stopped it  Thinks it caused loose bowels (but that may have been tumeric)_   Per pt = she may not have enough iron in diet   Patient Active Problem List   Diagnosis Date Noted   Iron deficiency anemia 11/03/2021   Hearing loss 11/03/2021   Current use of proton pump inhibitor 11/03/2021   Seasonal allergies 11/03/2021   Abnormal EKG 02/04/2021   Frequent falls 12/10/2020   CKD stage G3b/A1, 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) 06/25/2020   Chronic bilateral low back pain without sciatica 01/03/2019   Hand swelling 01/22/2016   Gastroesophageal reflux disease without esophagitis 03/22/2014   Venous stasis 07/19/2013   History of asthma 01/31/2013   Skin cancer of nose 04/20/2012   OA (osteoarthritis) of knee 04/20/2012   Ventral hernia - right lower quadrant 05/06/2011   Chronic calculus cholecystitis 05/06/2011   Lumbar spondylosis 08/02/2009   FACIAL PARESTHESIA, LEFT 08/02/2009   Hyperlipidemia 09/11/2008   Osteopenia 09/10/2008   POSTMENOPAUSAL STATUS  08/30/2008   Essential hypertension, benign 02/03/2007   Bilateral knee pain 02/03/2007   Edema 02/03/2007   Past Medical History:  Diagnosis Date   Asthma    childhood asthma   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 Tsuei, MD;  Location: [facility];  Service: General;  Laterality: N/A;   HERNIA REPAIR     left  inguinal hernia repair    NECK DISSECTION     L   OTHER SURGICAL HISTORY     right ankle ulcerated wound I and D    VENTRAL HERNIA REPAIR  05/22/2011   Procedure: HERNIA REPAIR VENTRAL ADULT;  Surgeon: Wilmon Arms. Corliss Skains, MD;  Location: [facility];  Service: General;  Laterality: N/A;   Social History   Tobacco Use   Smoking status: Never   Smokeless tobacco: Never  Substance Use Topics   Alcohol use: Yes    Alcohol/week: 7.0 standard drinks of alcohol    Types: 7 Glasses of wine per week    Comment: red wine    Drug use: No    Types: Hydromorphone   Family History  Problem Relation Age of Onset   Stroke Mother    Heart disease Father        AMI   Lung cancer Brother        lung   Allergies  Allergen Reactions   Tramadol Nausea Only   Current Outpatient Medications on File Prior to Visit  Medication Sig Dispense Refill   AMBULATORY NON FORMULARY MEDICATION Medication Name: *Cane.  DX: Lower extremity weakness 1 Units 0   celecoxib (CELEBREX) 100 MG capsule Take 1 capsule (100 mg total) by mouth daily. 90 capsule 1   chlorthalidone (HYGROTON) 25 MG tablet TAKE 1 TABLET BY MOUTH  DAILY 90 tablet 3   fexofenadine (ALLEGRA) 180 MG tablet Take 180 mg by mouth daily.     lisinopril (ZESTRIL) 40 MG tablet TAKE 1 TABLET BY MOUTH  DAILY 90 tablet 3    metoprolol tartrate (LOPRESSOR) 50 MG tablet TAKE 1 TABLET BY MOUTH TWICE  DAILY 200 tablet 0   Multiple Vitamins-Minerals (PRESERVISION AREDS 2 PO) Take 1 Capful by mouth daily.     pravastatin (PRAVACHOL) 20 MG tablet TAKE 1 TABLET BY MOUTH AT  BEDTIME NEED LAB WORK FOR FUTURE REFILLS 90 tablet 1   vitamin B-12 (CYANOCOBALAMIN) 500 MCG tablet Take 500 mcg by mouth daily.     No current facility-administered medications on file prior to visit.    Review of Systems  Constitutional:  Positive for fatigue. Negative for activity change, appetite change, fever and unexpected weight change.  HENT:  Negative for congestion, ear pain, rhinorrhea, sinus pressure and sore throat.   Eyes:  Negative for pain, redness and visual disturbance.  Respiratory:  Negative for cough, shortness of breath and wheezing.   Cardiovascular:  Negative for chest pain and palpitations.  Gastrointestinal:  Negative for abdominal pain, blood in stool, constipation and diarrhea.  Endocrine: Negative for polydipsia and polyuria.  Genitourinary:  Negative for dysuria, frequency and urgency.  Musculoskeletal:  Positive for arthralgias, back pain, gait problem and myalgias. Negative for joint swelling.  Skin:  Negative for pallor and rash.  Allergic/Immunologic: Negative for environmental allergies.  Neurological:  Negative for dizziness, syncope and headaches.  Hematological:  Negative for adenopathy. Does not bruise/bleed easily.  Psychiatric/Behavioral:  Negative for decreased concentration and dysphoric mood. The patient is not nervous/anxious.        Objective:   Physical Exam Constitutional:      General: She is not in acute distress.    Appearance: Normal appearance. She is well-developed and normal weight. She is not ill-appearing or diaphoretic.  HENT:     Head: Normocephalic and atraumatic.  Eyes:     Conjunctiva/sclera: Conjunctivae normal.     Pupils: Pupils are equal, round, and reactive to light.  Neck:      Thyroid: No thyromegaly.     Vascular: No carotid bruit or JVD.  Cardiovascular:     Rate and Rhythm: Normal rate and regular rhythm.     Heart sounds: Normal heart sounds.     No gallop.  Pulmonary:     Effort: Pulmonary effort is normal. No respiratory distress.     Breath sounds: Normal breath sounds. No wheezing or rales.  Abdominal:     General: There is no distension or abdominal bruit.     Palpations: Abdomen is soft.  Musculoskeletal:     Cervical back: Normal range of motion and neck supple.     Right lower leg: No edema.     Left lower leg: No edema.     Comments: Mild kyphosis   Lymphadenopathy:     Cervical: No cervical adenopathy.  Skin:    General: Skin is warm and dry.     Coloration: Skin is not jaundiced or pale.     Findings: No bruising or rash.  Neurological:     Mental Status: She is alert.     Coordination: Coordination normal.     Deep Tendon Reflexes: Reflexes are normal and symmetric. Reflexes normal.     Comments: Walking with cane Gait is labored due to pain   Psychiatric:        Mood and Affect: Mood is depressed.        Cognition and Memory: Cognition normal.           Assessment & Plan:   Problem List Items Addressed This Visit       Cardiovascular and Mediastinum   Essential hypertension, benign - Primary  bp in fair control at this time  BP Readings from Last 1 Encounters:  07/14/22 116/76  No changes needed Most recent labs reviewed  Disc lifstyle change with low sodium diet and exercise  Plan to continue Lisinopril 40 mg daily  Metoprolol 50 mg bid Chlorthalidone 25 mg daily for this and pedal edema  Lab today/ has h/o CKD      Relevant Orders   CBC with Differential/Platelet   Basic metabolic panel     Digestive   Gastroesophageal reflux disease without esophagitis    In good control as long as she takes omeprazole 40 mg daily and watches diet  Refill done today  No signs and symptoms of PUD /no abd pain (is  iron def)      Relevant Medications   omeprazole (PRILOSEC) 40 MG capsule     Nervous and Auditory   Hearing loss    Pt saw audiology  Seldom wears hearing aides because they annoy her         Musculoskeletal and Integument   Lumbar spondylosis    Per pt -had some shots (? Epidural)  Not effective  Uses cane and walker In chronic pain      Osteopenia    Declines dexa now but may consider later  No new falls (is prone) Encouraged vit D intake with ca if tolerated Exercise is limited due to her pain   Last dexa 2010         Genitourinary   CKD stage G3b/A1, 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)    Lab today Down graded nsaid to celebrex 100 mg daily   Encouraged water intake (but have to watch this due to mildly low na)        Other   Bilateral knee pain    Per pt injections did not help  Reviewed note with Dr Patsy Lager = did change nsaid to celebrex 100 mg daily (has ckd and afraid to give her more) Afraid to use sedating medicines due to h/o falls as well  Using cane In am when more pain and balance is worse-uses walker Tumeric did not help  Osteo bi flex helps slightly       Current use of proton pump inhibitor    Encouraged strongly to take B12 500 mcg daily   Lab Results  Component Value Date   VITAMINB12 279 11/03/2021   Also vit D for bone health      Edema    No changes Wears hose all the time Takes chlorthalidone      Frequent falls    No falls since last visit   This is reasurring Uses walker or cane        Hyperlipidemia    Disc goals for lipids and reasons to control them Rev last labs with pt Rev low sat fat diet in detail Taking pravastatin 20 mg dialy  LDL of 66 -well controlled       Iron deficiency anemia    Pt took iron short term (slow iron) but stopped for loose bowels  In retrospect it may have been tumeric /not iron that caused that  Will check labs today No GI symptoms Has CKD as well   May  try her slow iron or something new depending on results       Relevant Medications   vitamin B-12 (CYANOCOBALAMIN) 500 MCG tablet   Other Relevant Orders   CBC with Differential/Platelet   Iron

## 2022-07-14 NOTE — Assessment & Plan Note (Signed)
Lab today Down graded nsaid to celebrex 100 mg daily   Encouraged water intake (but have to watch this due to mildly low na)

## 2022-07-14 NOTE — Assessment & Plan Note (Signed)
Per pt injections did not help  Reviewed note with Dr Patsy Lager = did change nsaid to celebrex 100 mg daily (has ckd and afraid to give her more) Afraid to use sedating medicines due to h/o falls as well  Using cane In am when more pain and balance is worse-uses walker Tumeric did not help  Osteo bi flex helps slightly

## 2022-07-14 NOTE — Assessment & Plan Note (Signed)
bp in fair control at this time  BP Readings from Last 1 Encounters:  07/14/22 116/76   No changes needed Most recent labs reviewed  Disc lifstyle change with low sodium diet and exercise  Plan to continue Lisinopril 40 mg daily  Metoprolol 50 mg bid Chlorthalidone 25 mg daily for this and pedal edema  Lab today/ has h/o CKD

## 2022-07-14 NOTE — Assessment & Plan Note (Signed)
In good control as long as she takes omeprazole 40 mg daily and watches diet  Refill done today  No signs and symptoms of PUD /no abd pain (is iron def)

## 2022-07-14 NOTE — Assessment & Plan Note (Signed)
No changes Wears hose all the time Takes chlorthalidone

## 2022-08-25 DIAGNOSIS — M1712 Unilateral primary osteoarthritis, left knee: Secondary | ICD-10-CM | POA: Diagnosis not present

## 2022-08-25 DIAGNOSIS — M1711 Unilateral primary osteoarthritis, right knee: Secondary | ICD-10-CM | POA: Diagnosis not present

## 2022-08-25 DIAGNOSIS — M170 Bilateral primary osteoarthritis of knee: Secondary | ICD-10-CM | POA: Diagnosis not present

## 2022-08-27 ENCOUNTER — Other Ambulatory Visit: Payer: Self-pay | Admitting: Family Medicine

## 2022-08-31 ENCOUNTER — Other Ambulatory Visit: Payer: Self-pay | Admitting: Family Medicine

## 2022-08-31 MED ORDER — CELECOXIB 100 MG PO CAPS: 100.0000 mg | ORAL_CAPSULE | Freq: Every day | ORAL | 1 refills | Status: DC | PRN

## 2022-08-31 NOTE — Telephone Encounter (Signed)
Prescription Request  08/31/2022  LOV: 07/14/2022  What is the name of the medication or equipment?  celecoxib (CELEBREX) 100 MG capsule   Have you contacted your pharmacy to request a refill? Yes   Which pharmacy would you like this sent to?   [facility] [place], [place] - [address] [facility] [address] [place] [address] [place] [address] Phone: [phone] Fax: [phone]    Patient notified that their request is being sent to the clinical staff for review and that they should receive a response within 2 business days.   Please advise at [facility] [phone]  Patient stated that she is now taking 200 mg,instead of 100 mg.

## 2022-08-31 NOTE — Telephone Encounter (Signed)
Rx last filled by Dr. Patsy Lager on 12/15/21 #90 caps with 1 refill  Last f/u with PCP was on 07/14/22

## 2022-10-06 ENCOUNTER — Ambulatory Visit (INDEPENDENT_AMBULATORY_CARE_PROVIDER_SITE_OTHER): Payer: Medicare Other

## 2022-10-06 VITALS — Ht 64.0 in | Wt 135.0 lb

## 2022-10-06 DIAGNOSIS — Z0000 Encounter for general adult medical examination without abnormal findings: Secondary | ICD-10-CM

## 2022-10-06 NOTE — Patient Instructions (Signed)
Katherine Cohen , Thank you for taking time to come for your Medicare Wellness Visit. I appreciate your ongoing commitment to your health goals. Please review the following plan we discussed and let me know if I can assist you in the future.   Referrals/Orders/Follow-Ups/Clinician Recommendations: Aim for 30 minutes of exercise or brisk walking, 6-8 glasses of water, and 5 servings of fruits and vegetables each day.   This is a list of the screening recommended for you and due dates:  Health Maintenance  Topic Date Due   Zoster (Shingles) Vaccine (1 of 2) Never done   DTaP/Tdap/Td vaccine (2 - Tdap) 08/03/2017   COVID-19 Vaccine (3 - 2023-24 season) 10/24/2021   Medicare Annual Wellness Visit  10/26/2021   Flu Shot  09/24/2022   Colon Cancer Screening  01/03/2024*   Pneumonia Vaccine  Completed   DEXA scan (bone density measurement)  Completed   HPV Vaccine  Aged Out  *Topic was postponed. The date shown is not the original due date.    Advanced directives: (Copy Requested) Please bring a copy of your health care power of attorney and living will to the office to be added to your chart at your convenience.  Next Medicare Annual Wellness Visit scheduled for next year: Yes  Preventive Care 5 Years and Older, Female Preventive care refers to lifestyle choices and visits with your health care provider that can promote health and wellness. What does preventive care include? A yearly physical exam. This is also called an annual well check. Dental exams once or twice a year. Routine eye exams. Ask your health care provider how often you should have your eyes checked. Personal lifestyle choices, including: Daily care of your teeth and gums. Regular physical activity. Eating a healthy diet. Avoiding tobacco and drug use. Limiting alcohol use. Practicing safe sex. Taking low-dose aspirin every day. Taking vitamin and mineral supplements as recommended by your health care provider. What  happens during an annual well check? The services and screenings done by your health care provider during your annual well check will depend on your age, overall health, lifestyle risk factors, and family history of disease. Counseling  Your health care provider may ask you questions about your: Alcohol use. Tobacco use. Drug use. Emotional well-being. Home and relationship well-being. Sexual activity. Eating habits. History of falls. Memory and ability to understand (cognition). Work and work Astronomer. Reproductive health. Screening  You may have the following tests or measurements: Height, weight, and BMI. Blood pressure. Lipid and cholesterol levels. These may be checked every 5 years, or more frequently if you are over 18 years old. Skin check. Lung cancer screening. You may have this screening every year starting at age 46 if you have a 30-pack-year history of smoking and currently smoke or have quit within the past 15 years. Fecal occult blood test (FOBT) of the stool. You may have this test every year starting at age 28. Flexible sigmoidoscopy or colonoscopy. You may have a sigmoidoscopy every 5 years or a colonoscopy every 10 years starting at age 1. Hepatitis C blood test. Hepatitis B blood test. Sexually transmitted disease (STD) testing. Diabetes screening. This is done by checking your blood sugar (glucose) after you have not eaten for a while (fasting). You may have this done every 1-3 years. Bone density scan. This is done to screen for osteoporosis. You may have this done starting at age 64. Mammogram. This may be done every 1-2 years. Talk to your health care provider about  how often you should have regular mammograms. Talk with your health care provider about your test results, treatment options, and if necessary, the need for more tests. Vaccines  Your health care provider may recommend certain vaccines, such as: Influenza vaccine. This is recommended every  year. Tetanus, diphtheria, and acellular pertussis (Tdap, Td) vaccine. You may need a Td booster every 10 years. Zoster vaccine. You may need this after age 52. Pneumococcal 13-valent conjugate (PCV13) vaccine. One dose is recommended after age 42. Pneumococcal polysaccharide (PPSV23) vaccine. One dose is recommended after age 45. Talk to your health care provider about which screenings and vaccines you need and how often you need them. This information is not intended to replace advice given to you by your health care provider. Make sure you discuss any questions you have with your health care provider. Document Released: 03/08/2015 Document Revised: 10/30/2015 Document Reviewed: 12/11/2014 Elsevier Interactive Patient Education  2017 [employer].  Fall Prevention in the Home Falls can cause injuries. They can happen to people of all ages. There are many things you can do to make your home safe and to help prevent falls. What can I do on the outside of my home? Regularly fix the edges of walkways and driveways and fix any cracks. Remove anything that might make you trip as you walk through a door, such as a raised step or threshold. Trim any bushes or trees on the path to your home. Use bright outdoor lighting. Clear any walking paths of anything that might make someone trip, such as rocks or tools. Regularly check to see if handrails are loose or broken. Make sure that both sides of any steps have handrails. Any raised decks and porches should have guardrails on the edges. Have any leaves, snow, or ice cleared regularly. Use sand or salt on walking paths during winter. Clean up any spills in your garage right away. This includes oil or grease spills. What can I do in the bathroom? Use night lights. Install grab bars by the toilet and in the tub and shower. Do not use towel bars as grab bars. Use non-skid mats or decals in the tub or shower. If you need to sit down in the shower, use a  plastic, non-slip stool. Keep the floor dry. Clean up any water that spills on the floor as soon as it happens. Remove soap buildup in the tub or shower regularly. Attach bath mats securely with double-sided non-slip rug tape. Do not have throw rugs and other things on the floor that can make you trip. What can I do in the bedroom? Use night lights. Make sure that you have a light by your bed that is easy to reach. Do not use any sheets or blankets that are too big for your bed. They should not hang down onto the floor. Have a firm chair that has side arms. You can use this for support while you get dressed. Do not have throw rugs and other things on the floor that can make you trip. What can I do in the kitchen? Clean up any spills right away. Avoid walking on wet floors. Keep items that you use a lot in easy-to-reach places. If you need to reach something above you, use a strong step stool that has a grab bar. Keep electrical cords out of the way. Do not use floor polish or wax that makes floors slippery. If you must use wax, use non-skid floor wax. Do not have throw rugs and other  things on the floor that can make you trip. What can I do with my stairs? Do not leave any items on the stairs. Make sure that there are handrails on both sides of the stairs and use them. Fix handrails that are broken or loose. Make sure that handrails are as long as the stairways. Check any carpeting to make sure that it is firmly attached to the stairs. Fix any carpet that is loose or worn. Avoid having throw rugs at the top or bottom of the stairs. If you do have throw rugs, attach them to the floor with carpet tape. Make sure that you have a light switch at the top of the stairs and the bottom of the stairs. If you do not have them, ask someone to add them for you. What else can I do to help prevent falls? Wear shoes that: Do not have high heels. Have rubber bottoms. Are comfortable and fit you  well. Are closed at the toe. Do not wear sandals. If you use a stepladder: Make sure that it is fully opened. Do not climb a closed stepladder. Make sure that both sides of the stepladder are locked into place. Ask someone to hold it for you, if possible. Clearly mark and make sure that you can see: Any grab bars or handrails. First and last steps. Where the edge of each step is. Use tools that help you move around (mobility aids) if they are needed. These include: Canes. Walkers. Scooters. Crutches. Turn on the lights when you go into a dark area. Replace any light bulbs as soon as they burn out. Set up your furniture so you have a clear path. Avoid moving your furniture around. If any of your floors are uneven, fix them. If there are any pets around you, be aware of where they are. Review your medicines with your doctor. Some medicines can make you feel dizzy. This can increase your chance of falling. Ask your doctor what other things that you can do to help prevent falls. This information is not intended to replace advice given to you by your health care provider. Make sure you discuss any questions you have with your health care provider. Document Released: 12/06/2008 Document Revised: 07/18/2015 Document Reviewed: 03/16/2014 Elsevier Interactive Patient Education  2017 [employer].

## 2022-10-06 NOTE — Progress Notes (Signed)
Subjective:   Katherine Cohen is a 82 y.o. female who presents for Medicare Annual (Subsequent) preventive examination.  Visit Complete: Virtual  I connected with  Alwyn Pea on 10/06/22 by a audio enabled telemedicine application and verified that I am speaking with the correct person using two identifiers.  Patient Location: Home  Provider Location: Home Office  I discussed the limitations of evaluation and management by telemedicine. The patient expressed understanding and agreed to proceed.  Vital Signs: Unable to obtain new vitals due to this being a telehealth visit.Pt reported HT and WT   Review of Systems      Cardiac Risk Factors include: advanced age (>31men, >27 women);hypertension;dyslipidemia;sedentary lifestyle     Objective:    Today's Vitals   10/06/22 1257  Weight: 135 lb (61.2 kg)  Height: 5\' 4"  (1.626 m)   Body mass index is 23.17 kg/m.     10/06/2022    1:07 PM 10/26/2020   10:10 AM 07/19/2013   10:05 AM 05/22/2011    4:14 PM 05/14/2011    2:49 PM  Advanced Directives  Does Patient Have a Medical Advance Directive? No No Patient does not have advance directive;Patient would like information Patient does not have advance directive;Patient would not like information Patient does not have advance directive  Would patient like information on creating a medical advance directive? No - Patient declined  Advance directive brochure given (Outpatient ONLY)    Pre-existing out of facility DNR order (yellow form or pink MOST form)    No No    Current Medications (verified) Outpatient Encounter Medications as of 10/06/2022  Medication Sig   celecoxib (CELEBREX) 100 MG capsule Take 1 capsule (100 mg total) by mouth daily as needed for moderate pain.   chlorthalidone (HYGROTON) 25 MG tablet TAKE 1 TABLET BY MOUTH  DAILY   fexofenadine (ALLEGRA) 180 MG tablet Take 180 mg by mouth daily.   lisinopril (ZESTRIL) 40 MG tablet TAKE 1 TABLET BY MOUTH  DAILY    metoprolol tartrate (LOPRESSOR) 50 MG tablet TAKE 1 TABLET BY MOUTH TWICE  DAILY   Multiple Vitamins-Minerals (PRESERVISION AREDS 2 PO) Take 1 Capful by mouth daily.   omeprazole (PRILOSEC) 40 MG capsule Take 1 capsule (40 mg total) by mouth daily.   pravastatin (PRAVACHOL) 20 MG tablet TAKE 1 TABLET BY MOUTH AT  BEDTIME NEED LAB WORK FOR FUTURE REFILLS   AMBULATORY NON FORMULARY MEDICATION Medication Name: *Cane.  DX: Lower extremity weakness (Patient not taking: Reported on 10/06/2022)   vitamin B-12 (CYANOCOBALAMIN) 500 MCG tablet Take 500 mcg by mouth daily. (Patient not taking: Reported on 10/06/2022)   No facility-administered encounter medications on file as of 10/06/2022.    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 Corliss Skains, MD;  Location: [facility];  Service: General;  Laterality: N/A;   HERNIA REPAIR     left  inguinal hernia repair    NECK DISSECTION     L   OTHER SURGICAL HISTORY     right  ankle ulcerated wound I and D    VENTRAL HERNIA REPAIR  05/22/2011   Procedure: HERNIA REPAIR VENTRAL ADULT;  Surgeon: Wilmon Arms. Corliss Skains, MD;  Location: [facility];  Service: General;  Laterality: N/A;   Family History  Problem Relation Age of Onset   Stroke Mother    Heart disease Father        AMI   Lung cancer Brother        lung   Social History   Socioeconomic History   Marital status: Widowed    Spouse name: Not on file   Number of children: Not on file   Years of education: Not on file   Highest education level: Not on file  Occupational History   Not  on file  Tobacco Use   Smoking status: Never   Smokeless tobacco: Never  Substance and Sexual Activity   Alcohol use: Yes    Alcohol/week: 7.0 standard drinks of alcohol    Types: 7 Glasses of wine per week    Comment: red wine    Drug use: No    Types: Hydromorphone   Sexual activity: Not on file  Other Topics Concern   Not on file  Social History Narrative   Lives with son   Social Determinants of Health   Financial Resource Strain: Low Risk  (10/06/2022)   Overall Financial Resource Strain (CARDIA)    Difficulty of Paying Living Expenses: Not hard at all  Food Insecurity: No Food Insecurity (10/06/2022)   Hunger Vital Sign    Worried About Running Out of Food in the Last Year: Never true    Ran Out of Food in the Last Year: Never true  Transportation Needs: No Transportation Needs (10/06/2022)   PRAPARE - Administrator, Civil Service (Medical): No    Lack of Transportation (Non-Medical): No  Physical Activity: Inactive (10/06/2022)   Exercise Vital Sign    Days of Exercise per Week: 0 days    Minutes of Exercise per Session: 0 min  Stress: No Stress Concern Present (10/06/2022)   [employer] of Occupational Health - Occupational Stress Questionnaire    Feeling of Stress : Not at all  Social Connections: Moderately Isolated (10/06/2022)   Social Connection and Isolation Panel [NHANES]    Frequency of Communication with Friends and Family: More than three times a week    Frequency of Social Gatherings with Friends and Family: More than three times a week    Attends Religious Services: More than 4 times per year    Active Member of [employer] or Organizations: No    Attends Banker Meetings: Never    Marital Status: Widowed    Tobacco Counseling Counseling given: Not Answered   Clinical Intake:  Pre-visit preparation completed: Yes  Pain : No/denies pain     BMI - recorded: 23.17 Nutritional Status: BMI of 19-24  Normal Nutritional  Risks: None Diabetes: No  How often do you need to have someone help you when you read instructions, pamphlets, or other written materials from your doctor or pharmacy?: 1 - Never  Interpreter Needed?: No  Information entered by :: C. LPN   Activities of Daily Living    10/06/2022    1:10 PM  In your present state of health, do you have any difficulty performing the following activities:  Hearing? 1  Comment wears aids  Vision? 0  Difficulty concentrating or making decisions? 1  Comment occasionally has trouble recalling things  Walking  or climbing stairs? 1  Comment knees are stiff, son assists  Dressing or bathing? 0  Doing errands, shopping? 0  Preparing Food and eating ? N  Using the Toilet? N  In the past six months, have you accidently leaked urine? Y  Comment wears depends  Do you have problems with loss of bowel control? N  Managing your Medications? N  Managing your Finances? N  Housekeeping or managing your Housekeeping? N    Patient Care Team: Tower, Audrie Gallus, MD as PCP - General (Family Medicine)  Indicate any recent Medical Services you may have received from other than [facility] providers in the past year (date may be approximate).     Assessment:   This is a routine wellness examination for Donnarae.  Hearing/Vision screen Hearing Screening - Comments:: aids Vision Screening - Comments:: Glasses - UTD on eye exams with Eye care, unknown provider  Dietary issues and exercise activities discussed:     Goals Addressed             This Visit's Progress    Patient Stated       Stay active and walk better.       Depression Screen    10/06/2022    1:03 PM 07/14/2022   10:21 AM 10/26/2020   10:09 AM 06/24/2020   11:47 AM 03/12/2020    1:18 PM 09/21/2019   10:14 AM 01/03/2019    3:07 PM  PHQ 2/9 Scores  PHQ - 2 Score 0 1 0 0 3 0 0  PHQ- 9 Score  3    2     Fall Risk    10/06/2022    1:09 PM 07/14/2022   10:21 AM 10/26/2020   10:11 AM  06/24/2020   11:46 AM 08/14/2019   10:47 AM  Fall Risk   Falls in the past year? 0 1 1 0 0  Number falls in past yr: 0 1 1 0   Injury with Fall? 0 0 1 0   Risk for fall due to : No Fall Risks History of fall(s) Impaired balance/gait;Impaired mobility;Impaired vision No Fall Risks No Fall Risks  Follow up Falls prevention discussed;Falls evaluation completed Falls evaluation completed Falls prevention discussed Falls evaluation completed     MEDICARE RISK AT HOME:  Medicare Risk at Home - 10/06/22 1312     Any stairs in or around the home? Yes    If so, are there any without handrails? No    Home free of loose throw rugs in walkways, pet beds, electrical cords, etc? Yes    Adequate lighting in your home to reduce risk of falls? Yes    Life alert? No    Use of a cane, walker or w/c? Yes   walker and cane   Grab bars in the bathroom? Yes    Shower chair or bench in shower? Yes    Elevated toilet seat or a handicapped toilet? No             TIMED UP AND GO:  Was the test performed?  No    Cognitive Function:        10/06/2022    1:13 PM 10/26/2020   10:14 AM  6CIT Screen  What Year? 0 points 0 points  What month? 0 points 0 points  What time? 0 points 0 points  Count back from 20 0 points 0 points  Months in reverse 0 points 4 points  Repeat phrase 2 points 2 points  Total Score 2 points 6 points    Immunizations Immunization History  Administered Date(s) Administered   Janssen (J&J) SARS-COV-2 Vaccination 06/26/2019   Moderna Sars-Covid-2 Vaccination 01/08/2020   Pneumococcal Conjugate-13 03/22/2014   Pneumococcal Polysaccharide-23 08/04/2007   Td 08/04/2007    TDAP status: Due, Education has been provided regarding the importance of this vaccine. Advised may receive this vaccine at local pharmacy or Health Dept. Aware to provide a copy of the vaccination record if obtained from local pharmacy or Health Dept. Verbalized acceptance and understanding.  Flu Vaccine  status: Declined, Education has been provided regarding the importance of this vaccine but patient still declined. Advised may receive this vaccine at local pharmacy or Health Dept. Aware to provide a copy of the vaccination record if obtained from local pharmacy or Health Dept. Verbalized acceptance and understanding.  Pneumococcal vaccine status: Up to date  Covid-19 vaccine status: Declined, Education has been provided regarding the importance of this vaccine but patient still declined. Advised may receive this vaccine at local pharmacy or Health Dept.or vaccine clinic. Aware to provide a copy of the vaccination record if obtained from local pharmacy or Health Dept. Verbalized acceptance and understanding.  Qualifies for Shingles Vaccine? Yes   Zostavax completed No   Shingrix Completed?: No.    Education has been provided regarding the importance of this vaccine. Patient has been advised to call insurance company to determine out of pocket expense if they have not yet received this vaccine. Advised may also receive vaccine at local pharmacy or Health Dept. Verbalized acceptance and understanding.  Screening Tests Health Maintenance  Topic Date Due   Zoster Vaccines- Shingrix (1 of 2) Never done   DTaP/Tdap/Td (2 - Tdap) 08/03/2017   COVID-19 Vaccine (3 - 2023-24 season) 10/24/2021   INFLUENZA VACCINE  09/24/2022   Colonoscopy  01/03/2024 (Originally 04/29/2016)   Medicare Annual Wellness (AWV)  10/06/2023   Pneumonia Vaccine 42+ Years old  Completed   DEXA SCAN  Completed   HPV VACCINES  Aged Out    Health Maintenance  Health Maintenance Due  Topic Date Due   Zoster Vaccines- Shingrix (1 of 2) Never done   DTaP/Tdap/Td (2 - Tdap) 08/03/2017   COVID-19 Vaccine (3 - 2023-24 season) 10/24/2021   INFLUENZA VACCINE  09/24/2022    Colorectal cancer screening: No longer required.   Mammogram status: No longer required due to age.  Bone scan - Pt declined.  Lung Cancer Screening:  (Low Dose CT Chest recommended if Age 49-80 years, 20 pack-year currently smoking OR have quit w/in 15years.) does not qualify.   Lung Cancer Screening Referral: no  Additional Screening:  Hepatitis C Screening: does not qualify; Completed no  Vision Screening: Recommended annual ophthalmology exams for early detection of glaucoma and other disorders of the eye. Is the patient up to date with their annual eye exam?  Yes  Who is the provider or what is the name of the office in which the patient attends annual eye exams? [facility] unknown provider. If pt is not established with a provider, would they like to be referred to a provider to establish care? Yes .   Dental Screening: Recommended annual dental exams for proper oral hygiene   Community Resource Referral / Chronic Care Management: CRR required this visit?  No   CCM required this visit?  No     Plan:     I have personally reviewed and noted the following in the patient's chart:   Medical  and social history Use of alcohol, tobacco or illicit drugs  Current medications and supplements including opioid prescriptions. Patient is not currently taking opioid prescriptions. Functional ability and status Nutritional status Physical activity Advanced directives List of other physicians Hospitalizations, surgeries, and ER visits in previous 12 months Vitals Screenings to include cognitive, depression, and falls Referrals and appointments  In addition, I have reviewed and discussed with patient certain preventive protocols, quality metrics, and best practice recommendations. A written personalized care plan for preventive services as well as general preventive health recommendations were provided to patient.     Maryan Puls, LPN   9/60/4540   After Visit Summary: (Declined) Due to this being a telephonic visit, with patients personalized plan was offered to patient but patient Declined AVS at this time   Nurse  Notes: Vaccinations: declines  Influenza vaccine: recommend every Fall Shingles vaccine: recommend Shingrix which is 2 doses 2-6 months apart and over 90% effective     Covid-19: recommend 2 doses one month apart with a booster 6 months later

## 2022-11-09 ENCOUNTER — Other Ambulatory Visit: Payer: Self-pay | Admitting: Family Medicine

## 2022-11-09 ENCOUNTER — Telehealth: Payer: Self-pay | Admitting: Family Medicine

## 2022-11-09 MED ORDER — CHLORTHALIDONE 25 MG PO TABS: 25.0000 mg | ORAL_TABLET | Freq: Every day | ORAL | 1 refills | Status: DC

## 2022-11-09 MED ORDER — PRAVASTATIN SODIUM 20 MG PO TABS: ORAL_TABLET | ORAL | 1 refills | Status: DC

## 2022-11-09 MED ORDER — LISINOPRIL 40 MG PO TABS: 40.0000 mg | ORAL_TABLET | Freq: Every day | ORAL | 1 refills | Status: DC

## 2022-11-09 NOTE — Telephone Encounter (Signed)
Prescription Request  11/09/2022  LOV: 07/14/2022  What is the name of the medication or equipment? lisinopril (ZESTRIL) 40 MG tablet   chlorthalidone (HYGROTON) 25 MG tablet   Have you contacted your pharmacy to request a refill? No   Which pharmacy would you like this sent to?   [facility] - [place], [place] - [address] [place] [address] Phone: [phone] Fax: [phone]    Patient notified that their request is being sent to the clinical staff for review and that they should receive a response within 2 business days.   Please advise at [facility] [phone]

## 2023-01-04 ENCOUNTER — Other Ambulatory Visit: Payer: Self-pay | Admitting: Family Medicine

## 2023-01-13 ENCOUNTER — Other Ambulatory Visit: Payer: Self-pay | Admitting: Family Medicine

## 2023-01-29 ENCOUNTER — Other Ambulatory Visit: Payer: Self-pay | Admitting: Family Medicine

## 2023-03-09 ENCOUNTER — Encounter: Payer: Self-pay | Admitting: Family Medicine

## 2023-03-09 ENCOUNTER — Ambulatory Visit (INDEPENDENT_AMBULATORY_CARE_PROVIDER_SITE_OTHER): Payer: Medicare Other | Admitting: Family Medicine

## 2023-03-09 VITALS — BP 132/80 | HR 69 | Temp 98.1°F | Ht 64.0 in | Wt 141.1 lb

## 2023-03-09 DIAGNOSIS — I10 Essential (primary) hypertension: Secondary | ICD-10-CM

## 2023-03-09 DIAGNOSIS — Z1283 Encounter for screening for malignant neoplasm of skin: Secondary | ICD-10-CM

## 2023-03-09 DIAGNOSIS — C44301 Unspecified malignant neoplasm of skin of nose: Secondary | ICD-10-CM

## 2023-03-09 DIAGNOSIS — D509 Iron deficiency anemia, unspecified: Secondary | ICD-10-CM

## 2023-03-09 DIAGNOSIS — E785 Hyperlipidemia, unspecified: Secondary | ICD-10-CM

## 2023-03-09 DIAGNOSIS — Z79899 Other long term (current) drug therapy: Secondary | ICD-10-CM | POA: Diagnosis not present

## 2023-03-09 DIAGNOSIS — M170 Bilateral primary osteoarthritis of knee: Secondary | ICD-10-CM

## 2023-03-09 DIAGNOSIS — Z7409 Other reduced mobility: Secondary | ICD-10-CM | POA: Diagnosis not present

## 2023-03-09 DIAGNOSIS — J302 Other seasonal allergic rhinitis: Secondary | ICD-10-CM

## 2023-03-09 DIAGNOSIS — L821 Other seborrheic keratosis: Secondary | ICD-10-CM | POA: Diagnosis not present

## 2023-03-09 DIAGNOSIS — L989 Disorder of the skin and subcutaneous tissue, unspecified: Secondary | ICD-10-CM | POA: Diagnosis not present

## 2023-03-09 MED ORDER — CELECOXIB 100 MG PO CAPS: 100.0000 mg | ORAL_CAPSULE | Freq: Every day | ORAL | 0 refills | Status: DC | PRN

## 2023-03-09 NOTE — Patient Instructions (Addendum)
 Over the counter options for allergies /antihistamines (store brand is fine)  Allegra Zyrtec  Xyzal  Claritin   Some times you need to change it up   Stop the acne product on face   I like cetaphil cleanser and also moisturizer - this make help with the itching   Flonase or nasacort  are nasal sprays that help also   Here are the forms to take to [facility] for handicapped parking   I put the referral in for dermatology  Please let us  know if you don't hear in 1-2 weeks

## 2023-03-09 NOTE — Assessment & Plan Note (Signed)
 Bilateral Causing mobility improvement  Given forms for 2 handicapped placards- one for each vehicle

## 2023-03-09 NOTE — Assessment & Plan Note (Signed)
 Paperwork done for [employer] handicapped parking 2 vehicles  Due to knee OA

## 2023-03-09 NOTE — Assessment & Plan Note (Signed)
 2-3 mm hyperkeratotic warty pale area on right ar above elbow  Ref to derm for eval/removal

## 2023-03-09 NOTE — Assessment & Plan Note (Signed)
 Pt is interested in dermatology referral  Lots of sun exp when younger   Discussed skin cancer prevention

## 2023-03-09 NOTE — Assessment & Plan Note (Signed)
 Per pt allegra works ok now but in season (fall/spring) may not be enough  Given list of other antihistamines to try over the counter  Given list of steroid ns to try as well Handout given  Urged to avoid allergens  Call back and Er precautions noted in detail today

## 2023-03-09 NOTE — Assessment & Plan Note (Signed)
 In past Has skin graft Pt unsure if basal or squqmous or other  No access to her derm records/ out of town  Area feels more itchy now and she is worried No new lesions seen   Encouraged to stop using acne prodcts on it  Recommend cetaphil cleanser /moisturizer  Also avoid hot water Also sun protection   Ref done to dermatology

## 2023-03-09 NOTE — Assessment & Plan Note (Signed)
 Pt has itchy area near skin graft from prior skin cancer Urged to use cetaphil instead of oxy acne cleanser Reassuring exam   Ref to dermatology

## 2023-03-09 NOTE — Assessment & Plan Note (Signed)
 bp in fair control at this time  BP Readings from Last 1 Encounters:  03/09/23 132/80   No changes needed Most recent labs reviewed  Disc lifstyle change with low sodium diet and exercise  Plan to continue Lisinopril  40 mg daily  Metoprolol  50 mg bid Chlorthalidone  25 mg daily for this and pedal edema  Lab today/ has h/o CKD

## 2023-03-09 NOTE — Progress Notes (Signed)
 Subjective:    Patient ID: Katherine Cohen, female    DOB: 20-Jun-1940, 83 y.o.   MRN: [id number]  HPI  Wt Readings from Last 3 Encounters:  03/09/23 141 lb 2 oz (64 kg)  10/06/22 135 lb (61.2 kg)  07/14/22 135 lb 4 oz (61.3 kg)   24.22 kg/m  Vitals:   03/09/23 1155  BP: 132/80  Pulse: 69  Temp: 98.1 F (36.7 C)  SpO2: 94%    Pt presents for some skin concerns (mole check on neck and arm)  History of skin cancer on nose  Also allergies  Needs 2 handicapped placards  Chronic health problems incl HTN    Has OA of knees Limits mobility  Has history of falls   History of skin cancer on nose - maybe 6 years ago  Unsure if basal cell or squamous cell  Thinks it is coming back -is itchy ( sure sign for me) Did a skin graft on the area   Uses oxy acne product on face    Has a new spot on left cheek , itches a bit   Neck - had a cancer removed on neck (in [place] )  Over 30 years ago  Notices a new mole on that spot   Some skin change on right arm   A lot of sun exposure on past    Last Derm- was Dr Claudene in [place]    Allergic rhinitis  Takes allegra 180 mg daily  Doing ok with that  Spring and fall are the worst  No nasal sprays     HTN bp is stable today  No cp or palpitations or headaches or edema  No side effects to medicines  BP Readings from Last 3 Encounters:  03/09/23 132/80  07/14/22 116/76  12/15/21 134/86   Lisinopril  40 mg daily Metoprolol  50 mg bid Chlorthalidone  25 mg daily for this and pedal edema  Lab today/ has h/o CKD  Lab Results  Component Value Date   NA 138 07/14/2022   K 3.7 07/14/2022   CO2 31 07/14/2022   GLUCOSE 92 07/14/2022   BUN 25 (H) 07/14/2022   CREATININE 0.87 07/14/2022   CALCIUM 9.5 07/14/2022   GFR 62.10 07/14/2022   EGFR 49 (L) 12/10/2020   GFRNONAA 47 (L) 06/24/2020   Does take celebrex  - dose was decreased to 100 mg daily   Patient Active Problem List   Diagnosis Date Noted   Mobility  impaired 03/09/2023   Lesion of skin of nose 03/09/2023   Skin lesion of right arm 03/09/2023   Seborrheic keratoses 03/09/2023   Skin cancer screening 03/09/2023   Iron deficiency anemia 11/03/2021   Hearing loss 11/03/2021   Current use of proton pump inhibitor 11/03/2021   Seasonal allergies 11/03/2021   Abnormal EKG 02/04/2021   Frequent falls 12/10/2020   CKD stage G3b/A1, 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) 06/25/2020   Chronic bilateral low back pain without sciatica 01/03/2019   Hand swelling 01/22/2016   Gastroesophageal reflux disease without esophagitis 03/22/2014   Venous stasis 07/19/2013   History of asthma 01/31/2013   Skin cancer of nose 04/20/2012   OA (osteoarthritis) of knee 04/20/2012   Ventral hernia - right lower quadrant 05/06/2011   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   Lumbar spondylosis 08/02/2009   FACIAL PARESTHESIA, LEFT 08/02/2009   Hyperlipidemia 09/11/2008   Osteopenia 09/10/2008   Asymptomatic postmenopausal status 08/30/2008   Essential hypertension, benign 02/03/2007   Bilateral knee  pain 02/03/2007   Edema 02/03/2007   Past Medical History:  Diagnosis Date   Asthma    childhood asthma   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 Tsuei, MD;  Location: [facility];  Service: General;  Laterality: N/A;   HERNIA REPAIR     left  inguinal hernia repair    NECK DISSECTION     L   OTHER SURGICAL HISTORY     right ankle ulcerated wound I and D    VENTRAL HERNIA REPAIR  05/22/2011   Procedure: HERNIA REPAIR VENTRAL ADULT;  Surgeon: Donnice POUR. Belinda, MD;  Location: [facility];  Service: General;  Laterality: N/A;    Social History   Tobacco Use   Smoking status: Never   Smokeless tobacco: Never  Substance Use Topics   Alcohol use: Yes    Alcohol/week: 7.0 standard drinks of alcohol    Types: 7 Glasses of wine per week    Comment: red wine    Drug use: No    Types: Hydromorphone    Family History  Problem Relation Age of Onset   Stroke Mother    Heart disease Father        AMI   Lung cancer Brother        lung   Allergies  Allergen Reactions   Tramadol  Nausea Only   Current Outpatient Medications on File Prior to Visit  Medication Sig Dispense Refill   AMBULATORY NON FORMULARY MEDICATION Medication Name: *Cane.  DX: Lower extremity weakness 1 Units 0   chlorthalidone  (HYGROTON ) 25 MG tablet Take 1 tablet (25 mg total) by mouth daily. 90 tablet 1   fexofenadine (ALLEGRA) 180 MG tablet Take 180 mg by mouth daily.     lisinopril  (ZESTRIL ) 40 MG tablet Take 1 tablet (40 mg total) by mouth daily. 90 tablet 1   metoprolol  tartrate (LOPRESSOR ) 50 MG tablet TAKE 1 TABLET BY MOUTH TWICE  DAILY 200 tablet 0   Multiple Vitamins-Minerals (PRESERVISION AREDS 2 PO) Take 1 Capful by mouth daily.     omeprazole  (PRILOSEC ) 40 MG capsule Take 1 capsule (40 mg total) by mouth daily. 100 capsule 2   pravastatin  (PRAVACHOL ) 20 MG tablet TAKE 1 TABLET BY MOUTH AT  BEDTIME NEED LAB WORK FOR FUTURE REFILLS 90 tablet 1   vitamin B-12 (CYANOCOBALAMIN ) 500 MCG tablet Take 500 mcg by mouth daily.     No current facility-administered medications on file prior to visit.    Review of Systems  Constitutional:  Negative for activity change, appetite change, fatigue, fever and unexpected weight change.  HENT:  Positive for postnasal drip, rhinorrhea and sneezing. Negative for congestion, ear pain, sinus pressure and sore throat.   Eyes:  Negative for pain, redness and visual disturbance.  Respiratory:  Negative for cough, shortness of breath and wheezing.   Cardiovascular:  Negative for chest pain and palpitations.   Gastrointestinal:  Negative for abdominal pain, blood in stool, constipation and diarrhea.  Endocrine: Negative for polydipsia and polyuria.  Genitourinary:  Negative for dysuria, frequency and urgency.  Musculoskeletal:  Negative for arthralgias, back pain and myalgias.  Skin:  Negative for color change,  pallor and rash.       Lesion   Itching of nose   Allergic/Immunologic: Negative for environmental allergies.  Neurological:  Negative for dizziness, syncope and headaches.  Hematological:  Negative for adenopathy. Does not bruise/bleed easily.  Psychiatric/Behavioral:  Negative for decreased concentration and dysphoric mood. The patient is not nervous/anxious.        Objective:   Physical Exam Constitutional:      General: She is not in acute distress.    Appearance: Normal appearance. She is well-developed and normal weight. She is not ill-appearing or diaphoretic.  HENT:     Head: Normocephalic and atraumatic.     Right Ear: Tympanic membrane and ear canal normal.     Left Ear: Tympanic membrane and ear canal normal.     Nose: No congestion.     Comments: Boggy nares     Mouth/Throat:     Mouth: Mucous membranes are moist.     Pharynx: Oropharynx is clear.  Eyes:     General:        Right eye: No discharge.        Left eye: No discharge.     Conjunctiva/sclera: Conjunctivae normal.     Pupils: Pupils are equal, round, and reactive to light.  Neck:     Thyroid : No thyromegaly.     Vascular: No carotid bruit or JVD.  Cardiovascular:     Rate and Rhythm: Normal rate and regular rhythm.     Heart sounds: Normal heart sounds.     No gallop.  Pulmonary:     Effort: Pulmonary effort is normal. No respiratory distress.     Breath sounds: Normal breath sounds. No stridor. No wheezing, rhonchi or rales.  Chest:     Chest wall: No tenderness.  Abdominal:     General: There is no distension or abdominal bruit.     Palpations: Abdomen is soft.  Musculoskeletal:      Cervical back: Normal range of motion and neck supple.     Right lower leg: No edema.     Left lower leg: No edema.  Lymphadenopathy:     Cervical: No cervical adenopathy.  Skin:    General: Skin is warm and dry.     Coloration: Skin is not jaundiced or pale.     Findings: No bruising or rash.     Comments: Skin graft on mid nose  No new changes Area of itching at tip of nose looks mildly excoriated  Some telangectasia , possible rosacea   4 mm round SK /brown on left neck   2 mm keratotic warty lesion /pale in color right upper arm  Solar lentigines diffusely  Dry skin   Neurological:     Mental Status: She is alert.     Cranial Nerves: No cranial nerve deficit.     Coordination: Coordination normal.     Deep Tendon Reflexes: Reflexes are normal and symmetric. Reflexes normal.  Psychiatric:        Mood and Affect: Mood normal. Affect is blunt.           Assessment & Plan:   Problem List Items Addressed This Visit       Cardiovascular and Mediastinum   Essential hypertension, benign   bp in fair control at this time  BP Readings from Last 1 Encounters:  03/09/23 132/80   No changes needed Most recent labs reviewed  Disc lifstyle change with low sodium diet and exercise  Plan to continue  Lisinopril  40 mg daily  Metoprolol  50 mg bid Chlorthalidone  25 mg daily for this and pedal edema  Lab today/ has h/o CKD         Musculoskeletal and Integument   Skin lesion of right arm   2-3 mm hyperkeratotic warty pale area on right ar above elbow  Ref to derm for eval/removal       Relevant Orders   Ambulatory referral to Dermatology   Skin cancer of nose   In past Has skin graft Pt unsure if basal or squqmous or other  No access to her derm records/ out of town  Area feels more itchy now and she is worried No new lesions seen   Encouraged to stop using acne prodcts on it  Recommend cetaphil cleanser /moisturizer  Also avoid hot water Also sun protection    Ref done to dermatology       Relevant Orders   Ambulatory referral to Dermatology   Seborrheic keratoses   Oval area  Left neck  Appears benign       Relevant Orders   Ambulatory referral to Dermatology   OA (osteoarthritis) of knee   Bilateral Causing mobility improvement  Given forms for 2 handicapped placards- one for each vehicle       Relevant Medications   celecoxib  (CELEBREX ) 100 MG capsule   Lesion of skin of nose - Primary   Pt has itchy area near skin graft from prior skin cancer Urged to use cetaphil instead of oxy acne cleanser Reassuring exam   Ref to dermatology      Relevant Orders   Ambulatory referral to Dermatology     Other   Skin cancer screening   Pt is interested in dermatology referral  Lots of sun exp when younger   Discussed skin cancer prevention       Relevant Orders   Ambulatory referral to Dermatology   Seasonal allergies   Per pt allegra works ok now but in season (fall/spring) may not be enough  Given list of other antihistamines to try over the counter  Given list of steroid ns to try as well Handout given  Urged to avoid allergens  Call back and Er precautions noted in detail today        Mobility impaired   Paperwork done for [employer] handicapped parking 2 vehicles  Due to knee OA       Iron deficiency anemia   Hyperlipidemia   Current use of proton pump inhibitor

## 2023-03-09 NOTE — Assessment & Plan Note (Signed)
 Oval area  Left neck  Appears benign

## 2023-03-22 ENCOUNTER — Other Ambulatory Visit: Payer: Self-pay | Admitting: Family Medicine

## 2023-03-22 MED ORDER — METOPROLOL TARTRATE 50 MG PO TABS: 50.0000 mg | ORAL_TABLET | Freq: Two times a day (BID) | ORAL | 0 refills | Status: DC

## 2023-03-22 NOTE — Telephone Encounter (Signed)
Copied from CRM [id number]. Topic: Clinical - Medication Refill >> [name] 22, 2023  2:30 PM Elizebeth Brooking wrote: Most Recent Primary Care Visit:  Provider: Roxy Manns A  Department: [facility]  Visit Type: OFFICE VISIT  Date: 03/09/2023  Medication: metoprolol tartrate (LOPRESSOR) 50 MG tablet  Has the patient contacted their pharmacy? Yes (Agent: If no, request that the patient contact the pharmacy for the refill. If patient does not wish to contact the pharmacy document the reason why and proceed with request.) (Agent: If yes, when and what did the pharmacy advise?)  Is this the correct pharmacy for this prescription? Yes If no, delete pharmacy and type the correct one.  This is the patient's preferred pharmacy:   [facility] - [place], [place] - [address] [place] [address] Phone: [phone] Fax: [phone]   Has the prescription been filled recently? No  Is the patient out of the medication? Yes  Has the patient been seen for an appointment in the last year OR does the patient have an upcoming appointment? Yes  Can we respond through MyChart? Yes  Agent: Please be advised that Rx refills may take up to 3 business days. We ask that you follow-up with your pharmacy.

## 2023-03-23 ENCOUNTER — Other Ambulatory Visit: Payer: Self-pay | Admitting: Family Medicine

## 2023-03-30 ENCOUNTER — Other Ambulatory Visit: Payer: Self-pay | Admitting: Family Medicine

## 2023-03-31 ENCOUNTER — Other Ambulatory Visit: Payer: Self-pay | Admitting: Family Medicine

## 2023-04-05 ENCOUNTER — Ambulatory Visit: Payer: Medicare Other | Admitting: Dermatology

## 2023-04-07 ENCOUNTER — Telehealth: Payer: Medicare Other | Admitting: Physician Assistant

## 2023-04-07 DIAGNOSIS — J0110 Acute frontal sinusitis, unspecified: Secondary | ICD-10-CM

## 2023-04-07 NOTE — Progress Notes (Signed)
   Because of your age and your current medical conditions, I feel your condition warrants further evaluation and I recommend that you be seen in a face-to-face visit.   NOTE: There will be NO CHARGE for this E-Visit   If you are having a true medical emergency, please call 911.     For an urgent face to face visit, [facility] has multiple urgent care centers for your convenience.  Click the link below for the full list of locations and hours, walk-in wait times, appointment scheduling options and driving directions:  Urgent Care - [place], [place], [place], [place], [place], [place]  [place]     Your MyChart E-visit questionnaire answers were reviewed by a board certified advanced clinical practitioner to complete your personal care plan based on your specific symptoms.    Thank you for using e-Visits.

## 2023-04-08 ENCOUNTER — Telehealth: Payer: Medicare Other

## 2023-04-08 ENCOUNTER — Other Ambulatory Visit: Payer: Self-pay

## 2023-04-08 DIAGNOSIS — B9689 Other specified bacterial agents as the cause of diseases classified elsewhere: Secondary | ICD-10-CM | POA: Diagnosis not present

## 2023-04-08 DIAGNOSIS — J019 Acute sinusitis, unspecified: Secondary | ICD-10-CM | POA: Diagnosis not present

## 2023-04-08 MED ORDER — AMOXICILLIN-POT CLAVULANATE 875-125 MG PO TABS
1.0000 | ORAL_TABLET | Freq: Two times a day (BID) | ORAL | 0 refills | Status: AC
  Filled 2023-04-08: qty 14, 7d supply, fill #0

## 2023-04-08 MED ORDER — BENZONATATE 100 MG PO CAPS
100.0000 mg | ORAL_CAPSULE | Freq: Two times a day (BID) | ORAL | 0 refills | Status: AC
  Filled 2023-04-08: qty 60, 30d supply, fill #0

## 2023-04-08 NOTE — Patient Instructions (Addendum)
Katherine Cohen, thank you for joining Freddy Finner, NP for today's virtual visit.  While this provider is not your primary care provider (PCP), if your PCP is located in our provider database this encounter information will be shared with them immediately following your visit.   A [facility] MyChart account gives you access to today's visit and all your visits, tests, and labs performed at [facility] " click here if you don't have a [facility] MyChart account or go to [email or web address]  Consent: (Patient) Katherine Cohen provided verbal consent for this virtual visit at the beginning of the encounter.  Current Medications:  Current Outpatient Medications:    AMBULATORY NON FORMULARY MEDICATION, Medication Name: *Cane.  DX: Lower extremity weakness, Disp: 1 Units, Rfl: 0   amoxicillin-clavulanate (AUGMENTIN) 875-125 MG tablet, Take 1 tablet by mouth 2 (two) times daily for 7 days., Disp: 14 tablet, Rfl: 0   benzonatate (TESSALON) 100 MG capsule, Take 1 capsule (100 mg total) by mouth 2 (two) times daily., Disp: 60 capsule, Rfl: 0   celecoxib (CELEBREX) 100 MG capsule, Take 1 capsule (100 mg total) by mouth daily as needed for moderate pain (pain score 4-6)., Disp: 90 capsule, Rfl: 0   chlorthalidone (HYGROTON) 25 MG tablet, TAKE 1 TABLET BY MOUTH DAILY, Disp: 90 tablet, Rfl: 2   fexofenadine (ALLEGRA) 180 MG tablet, Take 180 mg by mouth daily., Disp: , Rfl:    lisinopril (ZESTRIL) 40 MG tablet, TAKE 1 TABLET BY MOUTH DAILY, Disp: 90 tablet, Rfl: 2   metoprolol tartrate (LOPRESSOR) 50 MG tablet, Take 1 tablet (50 mg total) by mouth 2 (two) times daily., Disp: 200 tablet, Rfl: 0   Multiple Vitamins-Minerals (PRESERVISION AREDS 2 PO), Take 1 Capful by mouth daily., Disp: , Rfl:    omeprazole (PRILOSEC) 40 MG capsule, TAKE 1 CAPSULE BY MOUTH DAILY, Disp: 100 capsule, Rfl: 2   pravastatin (PRAVACHOL) 20 MG tablet, TAKE 1 TABLET BY MOUTH AT  BEDTIME, Disp: 90 tablet, Rfl: 2    vitamin B-12 (CYANOCOBALAMIN) 500 MCG tablet, Take 500 mcg by mouth daily., Disp: , Rfl:    Medications ordered in this encounter:  Meds ordered this encounter  Medications   amoxicillin-clavulanate (AUGMENTIN) 875-125 MG tablet    Sig: Take 1 tablet by mouth 2 (two) times daily for 7 days.    Dispense:  14 tablet    Refill:  0    Supervising Provider:   Merrilee Jansky [[id number]]   benzonatate (TESSALON) 100 MG capsule    Sig: Take 1 capsule (100 mg total) by mouth 2 (two) times daily.    Dispense:  60 capsule    Refill:  0    Supervising Provider:   Merrilee Jansky [[id number]]     *If you need refills on other medications prior to your next appointment, please contact your pharmacy*  Follow-Up: Call back or seek an in-person evaluation if the symptoms worsen or if the condition fails to improve as anticipated.  [facility] [phone]  Other Instructions  - Increased rest - Increasing Fluids - Acetaminophen / ibuprofen as needed for fever/pain.  - Salt water gargling and throat lozenges - Saline nasal spray if congestion or if nasal passages feel dry. - Humidifying the air.     If you have been instructed to have an in-person evaluation today at a local Urgent Care facility, please use the link below. It will take you to a list of all of our available  [facility], including address, phone number and hours of operation. Please do not delay care.  [facility]  If you or a family member do not have a primary care provider, use the link below to schedule a visit and establish care. When you choose a [facility] primary care physician or advanced practice provider, you gain a long-term partner in health. Find a Primary Care Provider  Learn more about [facility] in-office and virtual care options: [facility] Get Care Now

## 2023-04-08 NOTE — Progress Notes (Signed)
Virtual Visit Consent   Katherine Cohen, you are scheduled for a virtual visit with a [facility] provider today. Just as with appointments in the office, your consent must be obtained to participate. Your consent will be active for this visit and any virtual visit you may have with one of our providers in the next 365 days. If you have a MyChart account, a copy of this consent can be sent to you electronically.  As this is a virtual visit, video technology does not allow for your provider to perform a traditional examination. This may limit your provider's ability to fully assess your condition. If your provider identifies any concerns that need to be evaluated in person or the need to arrange testing (such as labs, EKG, etc.), we will make arrangements to do so. Although advances in technology are sophisticated, we cannot ensure that it will always work on either your end or our end. If the connection with a video visit is poor, the visit may have to be switched to a telephone visit. With either a video or telephone visit, we are not always able to ensure that we have a secure connection.  By engaging in this virtual visit, you consent to the provision of healthcare and authorize for your insurance to be billed (if applicable) for the services provided during this visit. Depending on your insurance coverage, you may receive a charge related to this service.  I need to obtain your verbal consent now. Are you willing to proceed with your visit today? Katherine Cohen has provided verbal consent on 04/08/2023 for a virtual visit (video or telephone). Freddy Finner, NP  Date: 04/08/2023 1:30 PM   Virtual Visit via Video Note   I, Freddy Finner, connected with  Katherine Cohen  ([id number], 83) on 04/08/23 at  1:30 PM EST by a video-enabled telemedicine application and verified that I am speaking with the correct person using two identifiers.  Location: Patient: Virtual Visit Location Patient:  Home Provider: Virtual Visit Location Provider: Home Office   I discussed the limitations of evaluation and management by telemedicine and the availability of in person appointments. The patient expressed understanding and agreed to proceed.    History of Present Illness: Katherine Cohen is a 83 y.o. who identifies as a female who was assigned female at birth, and is being seen today for sinus congestion/  Onset was over a week ago- sinus congestion, mucus at times- color is white/cloudy,  Associated symptoms are sore throat at times due to post nasal drip-coughing a lot related to mucus  Modifying factors are  Denies chest pain, shortness of breath, fevers, chills  Exposure to sick contacts- known- son was sick COVID test: no    Problems:  Patient Active Problem List   Diagnosis Date Noted   Mobility impaired 03/09/2023   Lesion of skin of nose 03/09/2023   Skin lesion of right arm 03/09/2023   Seborrheic keratoses 03/09/2023   Skin cancer screening 03/09/2023   Iron deficiency anemia 11/03/2021   Hearing loss 11/03/2021   Current use of proton pump inhibitor 11/03/2021   Seasonal allergies 11/03/2021   Abnormal EKG 02/04/2021   Frequent falls 12/10/2020   CKD stage G3b/A1, 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) 06/25/2020   Chronic bilateral low back pain without sciatica 01/03/2019   Hand swelling 01/22/2016   Gastroesophageal reflux disease without esophagitis 03/22/2014   Venous stasis 07/19/2013   History of asthma 01/31/2013  Skin cancer of nose 04/20/2012   OA (osteoarthritis) of knee 04/20/2012   Ventral hernia - right lower quadrant 05/06/2011   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   Lumbar spondylosis 08/02/2009   FACIAL PARESTHESIA, LEFT 08/02/2009   Hyperlipidemia 09/11/2008   Osteopenia 09/10/2008   Asymptomatic postmenopausal status 08/30/2008   Essential hypertension, benign 02/03/2007   Bilateral knee pain 02/03/2007   Edema  02/03/2007    Allergies:  Allergies  Allergen Reactions   Tramadol Nausea Only   Medications:  Current Outpatient Medications:    AMBULATORY NON FORMULARY MEDICATION, Medication Name: *Cane.  DX: Lower extremity weakness, Disp: 1 Units, Rfl: 0   celecoxib (CELEBREX) 100 MG capsule, Take 1 capsule (100 mg total) by mouth daily as needed for moderate pain (pain score 4-6)., Disp: 90 capsule, Rfl: 0   chlorthalidone (HYGROTON) 25 MG tablet, TAKE 1 TABLET BY MOUTH DAILY, Disp: 90 tablet, Rfl: 2   fexofenadine (ALLEGRA) 180 MG tablet, Take 180 mg by mouth daily., Disp: , Rfl:    lisinopril (ZESTRIL) 40 MG tablet, TAKE 1 TABLET BY MOUTH DAILY, Disp: 90 tablet, Rfl: 2   metoprolol tartrate (LOPRESSOR) 50 MG tablet, Take 1 tablet (50 mg total) by mouth 2 (two) times daily., Disp: 200 tablet, Rfl: 0   Multiple Vitamins-Minerals (PRESERVISION AREDS 2 PO), Take 1 Capful by mouth daily., Disp: , Rfl:    omeprazole (PRILOSEC) 40 MG capsule, TAKE 1 CAPSULE BY MOUTH DAILY, Disp: 100 capsule, Rfl: 2   pravastatin (PRAVACHOL) 20 MG tablet, TAKE 1 TABLET BY MOUTH AT  BEDTIME, Disp: 90 tablet, Rfl: 2   vitamin B-12 (CYANOCOBALAMIN) 500 MCG tablet, Take 500 mcg by mouth daily., Disp: , Rfl:   Observations/Objective: Patient is well-developed, well-nourished in no acute distress.  Resting comfortably  at home.  Head is normocephalic, atraumatic.  No labored breathing.  Speech is clear and coherent with logical content.  Patient is alert and oriented at baseline.  Congested and cough present   Assessment and Plan:  1. Acute bacterial sinusitis (Primary)  - amoxicillin-clavulanate (AUGMENTIN) 875-125 MG tablet; Take 1 tablet by mouth 2 (two) times daily for 7 days.  Dispense: 14 tablet; Refill: 0 - benzonatate (TESSALON) 100 MG capsule; Take 1 capsule (100 mg total) by mouth 2 (two) times daily.  Dispense: 60 capsule; Refill: 0   - Increased rest - Increasing Fluids - Acetaminophen / ibuprofen as  needed for fever/pain.  - Salt water gargling and throat lozenges - Saline nasal spray if congestion or if nasal passages feel dry. - Humidifying the air.     Reviewed side effects, risks and benefits of medication.    Patient acknowledged agreement and understanding of the plan.   Past Medical, Surgical, Social History, Allergies, and Medications have been Reviewed.     Follow Up Instructions: I discussed the assessment and treatment plan with the patient. The patient was provided an opportunity to ask questions and all were answered. The patient agreed with the plan and demonstrated an understanding of the instructions.  A copy of instructions were sent to the patient via MyChart unless otherwise noted below.    The patient was advised to call back or seek an in-person evaluation if the symptoms worsen or if the condition fails to improve as anticipated.    Freddy Finner, NP

## 2023-04-19 ENCOUNTER — Encounter: Payer: Self-pay | Admitting: Dermatology

## 2023-04-19 ENCOUNTER — Ambulatory Visit: Payer: Medicare Other | Admitting: Dermatology

## 2023-04-19 VITALS — BP 132/93 | HR 68

## 2023-04-19 DIAGNOSIS — Z1283 Encounter for screening for malignant neoplasm of skin: Secondary | ICD-10-CM

## 2023-04-19 DIAGNOSIS — D229 Melanocytic nevi, unspecified: Secondary | ICD-10-CM

## 2023-04-19 DIAGNOSIS — Z85828 Personal history of other malignant neoplasm of skin: Secondary | ICD-10-CM

## 2023-04-19 DIAGNOSIS — D2261 Melanocytic nevi of right upper limb, including shoulder: Secondary | ICD-10-CM | POA: Diagnosis not present

## 2023-04-19 DIAGNOSIS — L578 Other skin changes due to chronic exposure to nonionizing radiation: Secondary | ICD-10-CM

## 2023-04-19 DIAGNOSIS — L821 Other seborrheic keratosis: Secondary | ICD-10-CM | POA: Diagnosis not present

## 2023-04-19 DIAGNOSIS — L814 Other melanin hyperpigmentation: Secondary | ICD-10-CM

## 2023-04-19 DIAGNOSIS — C44329 Squamous cell carcinoma of skin of other parts of face: Secondary | ICD-10-CM

## 2023-04-19 DIAGNOSIS — W908XXA Exposure to other nonionizing radiation, initial encounter: Secondary | ICD-10-CM | POA: Diagnosis not present

## 2023-04-19 DIAGNOSIS — D0461 Carcinoma in situ of skin of right upper limb, including shoulder: Secondary | ICD-10-CM

## 2023-04-19 DIAGNOSIS — D492 Neoplasm of unspecified behavior of bone, soft tissue, and skin: Secondary | ICD-10-CM

## 2023-04-19 DIAGNOSIS — D1801 Hemangioma of skin and subcutaneous tissue: Secondary | ICD-10-CM | POA: Diagnosis not present

## 2023-04-19 DIAGNOSIS — L570 Actinic keratosis: Secondary | ICD-10-CM | POA: Diagnosis not present

## 2023-04-19 DIAGNOSIS — D485 Neoplasm of uncertain behavior of skin: Secondary | ICD-10-CM

## 2023-04-19 NOTE — Progress Notes (Signed)
 New Patient Visit   Subjective  Katherine Cohen is a 83 y.o. female who presents for the following: Skin Cancer Screening and Full Body Skin Exam.  Hx and family hx of NMSC.  She is accompanied by her son. She lived in [place] 20 years and got a lot of son.  The patient presents for pper Body Skin Exam (UBSE) for skin cancer screening and mole check. The patient has spots, moles and lesions to be evaluated, some may be new or changing.  The following portions of the chart were reviewed this encounter and updated as appropriate: medications, allergies, medical history  Review of Systems:  No other skin or systemic complaints except as noted in HPI or Assessment and Plan.  Objective  Well appearing patient in no apparent distress; mood and affect are within normal limits.  A full examination was performed including scalp, head, eyes, ears, nose, lips, neck, chest, bilateral upper extremities, hands, fingers, and fingernails. All findings within normal limits unless otherwise noted below.   Relevant physical exam findings are noted in the Assessment and Plan.  Right Elbow - Posterior 4 mm cutaneous horn   Left Buccal Cheek 7 mm indurated scaly patch  Right Antecubital Fossa 1 cm tan brown plaque   Assessment & Plan   SKIN CANCER SCREENING PERFORMED TODAY.  ACTINIC DAMAGE - Chronic condition, secondary to cumulative UV/sun exposure - diffuse scaly erythematous macules with underlying dyspigmentation - Recommend daily broad spectrum sunscreen SPF 30+ to sun-exposed areas, reapply every 2 hours as needed.  - Staying in the shade or wearing long sleeves, sun glasses (UVA+UVB protection) and wide brim hats (4-inch brim around the entire circumference of the hat) are also recommended for sun protection.  - Call for new or changing lesions.  LENTIGINES, SEBORRHEIC KERATOSES, HEMANGIOMAS - Benign normal skin lesions - Benign-appearing - Call for any changes  MELANOCYTIC  NEVI - Tan-brown and/or pink-flesh-colored symmetric macules and papules - Benign appearing on exam today - Observation - Call clinic for new or changing moles - Recommend daily use of broad spectrum spf 30+ sunscreen to sun-exposed areas.   AK (ACTINIC KERATOSIS) Left Upper Arm - Anterior Destruction of lesion - Left Upper Arm - Anterior Complexity: extensive   Destruction method: cryotherapy   Timeout:  patient name, date of birth, surgical site, and procedure verified NEOPLASM OF UNCERTAIN BEHAVIOR OF SKIN (3) Right Elbow - Posterior Skin / nail biopsy Type of biopsy: tangential   Informed consent: discussed and consent obtained   Timeout: patient name, date of birth, surgical site, and procedure verified   Procedure prep:  Patient was prepped and draped in usual sterile fashion Prep type:  Isopropyl alcohol Anesthesia: the lesion was anesthetized in a standard fashion   Anesthetic:  1% lidocaine w/ epinephrine 1-100,000 buffered w/ 8.4% NaHCO3 Instrument used: DermaBlade   Hemostasis achieved with: aluminum chloride   Outcome: patient tolerated procedure well   Post-procedure details: sterile dressing applied and wound care instructions given   Dressing type: bandage and petrolatum   Specimen 1 - Surgical pathology Differential Diagnosis: R/O AK  VS SCC VS WART  Check Margins: No Left Buccal Cheek Skin / nail biopsy Type of biopsy: tangential   Informed consent: discussed and consent obtained   Timeout: patient name, date of birth, surgical site, and procedure verified   Instrument used: DermaBlade   Hemostasis achieved with: aluminum chloride   Outcome: patient tolerated procedure well   Post-procedure details: sterile dressing applied  and wound care instructions given   Dressing type: bandage and petrolatum   Specimen 2 - Surgical pathology Differential Diagnosis: R/O NMSC vs other  Check Margins: No Right Antecubital Fossa Skin / nail biopsy Type of biopsy:  tangential   Informed consent: discussed and consent obtained   Timeout: patient name, date of birth, surgical site, and procedure verified   Procedure prep:  Patient was prepped and draped in usual sterile fashion Prep type:  Isopropyl alcohol Anesthesia: the lesion was anesthetized in a standard fashion   Anesthetic:  1% lidocaine w/ epinephrine 1-100,000 buffered w/ 8.4% NaHCO3 Instrument used: DermaBlade   Hemostasis achieved with: aluminum chloride   Outcome: patient tolerated procedure well   Post-procedure details: sterile dressing applied and wound care instructions given   Dressing type: bandage and petrolatum   Specimen 3 - Surgical pathology Differential Diagnosis: r/o MM vs other  Check Margins: No  HISTORY OF SKIN CANCER - Clear. Observe for recurrence.  - Call clinic for new or changing lesions.   - Recommend regular skin exams, daily broad-spectrum spf 30+ sunscreen use, and photoprotection.      Return if symptoms worsen or fail to improve.  Dominga Ferry, Surg Tech III, am acting as scribe for Gwenith Daily, MD.   Documentation: I have reviewed the above documentation for accuracy and completeness, and I agree with the above.  Gwenith Daily, MD

## 2023-04-19 NOTE — Patient Instructions (Addendum)
 Patient Handout: Wound Care for Skin Biopsy Site  Taking Care of Your Skin Biopsy Site  Proper care of the biopsy site is essential for promoting healing and minimizing scarring. This handout provides instructions on how to care for your biopsy site to ensure optimal recovery.  1. Cleaning the Wound:  Clean the biopsy site daily with gentle soap and water. Gently pat the area dry with a clean, soft towel. Avoid harsh scrubbing or rubbing the area, as this can irritate the skin and delay healing.  2. Applying Aquaphor and Bandage:  After cleaning the wound, apply a thin layer of Aquaphor ointment to the biopsy site. Cover the area with a sterile bandage to protect it from dirt, bacteria, and friction. Change the bandage daily or as needed if it becomes soiled or wet.  3. Continued Care for One Week:  Repeat the cleaning, Aquaphor application, and bandaging process daily for one week following the biopsy procedure. Keeping the wound clean and moist during this initial healing period will help prevent infection and promote optimal healing.  4. Massaging Aquaphor into the Area:  ---After one week, discontinue the use of bandages but continue to apply Aquaphor to the biopsy site. ----Gently massage the Aquaphor into the area using circular motions. ---Massaging the skin helps to promote circulation and prevent the formation of scar tissue.   Additional Tips:  Avoid exposing the biopsy site to direct sunlight during the healing process, as this can cause hyperpigmentation or worsen scarring. If you experience any signs of infection, such as increased redness, swelling, warmth, or drainage from the wound, contact your healthcare provider immediately. Follow any additional instructions provided by your healthcare provider for caring for the biopsy site and managing any discomfort. Conclusion:  Taking proper care of your skin biopsy site is crucial for ensuring optimal healing and  minimizing scarring. By following these instructions for cleaning, applying Aquaphor, and massaging the area, you can promote a smooth and successful recovery. If you have any questions or concerns about caring for your biopsy site, don't hesitate to contact your healthcare provider for guidance.   Important Information  Due to recent changes in healthcare laws, you may see results of your pathology and/or laboratory studies on MyChart before the doctors have had a chance to review them. We understand that in some cases there may be results that are confusing or concerning to you. Please understand that not all results are received at the same time and often the doctors may need to interpret multiple results in order to provide you with the best plan of care or course of treatment. Therefore, we ask that you please give [place] 2 business days to thoroughly review all your results before contacting the office for clarification. Should we see a critical lab result, you will be contacted sooner.   If You Need Anything After Your Visit  If you have any questions or concerns for your doctor, please call our main line at [phone] If no one answers, please leave a voicemail as directed and we will return your call as soon as possible. Messages left after 4 pm will be answered the following business day.   You may also send [place] a message via MyChart. We typically respond to MyChart messages within 1-2 business days.  For prescription refills, please ask your pharmacy to contact our office. Our fax number is [phone].  If you have an urgent issue when the clinic is closed that cannot wait until the next business day,  you can page your doctor at the number below.    Please note that while we do our best to be available for urgent issues outside of office hours, we are not available 24/7.   If you have an urgent issue and are unable to reach [place], you may choose to seek medical care at your doctor's office,  retail clinic, urgent care center, or emergency room.  If you have a medical emergency, please immediately call 911 or go to the emergency department. In the event of inclement weather, please call our main line at [phone] for an update on the status of any delays or closures.  Dermatology Medication Tips: Please keep the boxes that topical medications come in in order to help keep track of the instructions about where and how to use these. Pharmacies typically print the medication instructions only on the boxes and not directly on the medication tubes.   If your medication is too expensive, please contact our office at [phone] or send [place] a message through MyChart.   We are unable to tell what your co-pay for medications will be in advance as this is different depending on your insurance coverage. However, we may be able to find a substitute medication at lower cost or fill out paperwork to get insurance to cover a needed medication.   If a prior authorization is required to get your medication covered by your insurance company, please allow [place] 1-2 business days to complete this process.  Drug prices often vary depending on where the prescription is filled and some pharmacies may offer cheaper prices.  The website www.goodrx.com contains coupons for medications through different pharmacies. The prices here do not account for what the cost may be with help from insurance (it may be cheaper with your insurance), but the website can give you the price if you did not use any insurance.  - You can print the associated coupon and take it with your prescription to the pharmacy.  - You may also stop by our office during regular business hours and pick up a GoodRx coupon card.  - If you need your prescription sent electronically to a different pharmacy, notify our office through [facility] or by phone at [phone]    Skin Education :   I counseled the patient regarding the  following: Sun screen (SPF 30 or greater) should be applied during peak UV exposure (between 10am and 2pm) and reapplied after exercise or swimming.  The ABCDEs of melanoma were reviewed with the patient, and the importance of monthly self-examination of moles was emphasized. Should any moles change in shape or color, or itch, bleed or burn, pt will contact our office for evaluation sooner then their interval appointment.  Plan: Sunscreen Recommendations I recommended a broad spectrum sunscreen with a SPF of 30 or higher. I explained that SPF 30 sunscreens block approximately 97 percent of the sun's harmful rays. Sunscreens should be applied at least 15 minutes prior to expected sun exposure and then every 2 hours after that as long as sun exposure continues. If swimming or exercising sunscreen should be reapplied every 45 minutes to an hour after getting wet or sweating. One ounce, or the equivalent of a shot glass full of sunscreen, is adequate to protect the skin not covered by a bathing suit. I also recommended a lip balm with a sunscreen as well. Sun protective clothing can be used in lieu of sunscreen but must be worn the entire time you are exposed  to the sun's rays. Liquid nitrogen was applied for 10-12 seconds to the skin lesion and the expected blistering or scabbing reaction explained. Do not pick at the area. Patient reminded to expect hypopigmented scars from the procedure. Return if lesion fails to fully resolve. For areas treated with Liquid Nitrogen:  Keep clean with soap and water.  Apply Vaseline or Aquaphor twice daily.

## 2023-04-21 LAB — SURGICAL PATHOLOGY

## 2023-04-28 ENCOUNTER — Telehealth: Payer: Self-pay | Admitting: Dermatology

## 2023-04-28 NOTE — Telephone Encounter (Signed)
-----   Message from [facility] sent at 04/28/2023 11:20 AM EST ----- 1. SCCIS with wart- right elbow- EDC 2. SCC- left cheek- Mohs with me 3. DN moderate- right antecubital fossa- reassurance, monitor  Please call patient to discuss diagnosis and schedule for Mohs surgery.

## 2023-04-28 NOTE — Telephone Encounter (Signed)
 Called and went over results and explained the different procedures and what to expect the day of surgery.  Pt understood and was ok for me to send it to scheduling.

## 2023-05-10 ENCOUNTER — Other Ambulatory Visit: Payer: Self-pay | Admitting: Family Medicine

## 2023-05-12 ENCOUNTER — Encounter: Payer: Self-pay | Admitting: Dermatology

## 2023-05-13 ENCOUNTER — Ambulatory Visit: Admitting: Dermatology

## 2023-05-13 ENCOUNTER — Encounter: Payer: Self-pay | Admitting: Dermatology

## 2023-05-13 VITALS — BP 131/73 | HR 75 | Temp 98.2°F

## 2023-05-13 DIAGNOSIS — L579 Skin changes due to chronic exposure to nonionizing radiation, unspecified: Secondary | ICD-10-CM

## 2023-05-13 DIAGNOSIS — C44329 Squamous cell carcinoma of skin of other parts of face: Secondary | ICD-10-CM

## 2023-05-13 DIAGNOSIS — L814 Other melanin hyperpigmentation: Secondary | ICD-10-CM

## 2023-05-13 DIAGNOSIS — C4492 Squamous cell carcinoma of skin, unspecified: Secondary | ICD-10-CM

## 2023-05-13 NOTE — Patient Instructions (Signed)

## 2023-05-13 NOTE — Progress Notes (Signed)
 Follow-Up Visit   Subjective  Katherine Cohen is a 83 y.o. 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 Caralyn Guile.   The following portions of the chart were reviewed this encounter and updated as appropriate: medications, allergies, medical history  Review of Systems:  No other skin or systemic complaints except as noted in HPI or Assessment and Plan.  Objective  Well appearing patient in no apparent distress; mood and affect are within normal limits.  A focused examination was performed of the following areas: Left cheek Relevant physical exam findings are noted in the Assessment and Plan.   Left Cheek Healing biopsy site   Assessment & Plan   SQUAMOUS CELL CARCINOMA OF SKIN Left Cheek Mohs surgery  Consent obtained: written  Anticoagulation: Was the anticoagulation regimen changed prior to Mohs? No    Anesthesia: Anesthesia method: local infiltration Local anesthetic: lidocaine 1% WITH epi  Procedure Details: Timeout: pre-procedure verification complete Procedure Prep: patient was prepped and draped in usual sterile fashion Prep type: chlorhexidine Pre-Op diagnosis: squamous cell carcinoma SCC subtype: well differentiated Additional SCC characteristics: perineural/intraneural invasion MohsAIQ Surgical site (if tumor spans multiple areas, please select predominant area): cheek (including jawline) Surgery side: left Surgical site (from skin exam): Left Cheek Pre-operative length (cm): 1.1 Pre-operative width (cm): 0.6 Indications for Mohs surgery: anatomic location where tissue conservation is critical  Skin repair Complexity:  Complex Final length (cm):  4.2 Informed consent: discussed and consent obtained   Timeout: patient name, date of birth, surgical site, and procedure verified   Procedure prep:  Patient was prepped and draped in usual sterile fashion Prep type:   Chlorhexidine Anesthesia: the lesion was anesthetized in a standard fashion   Anesthetic:  1% lidocaine w/ epinephrine 1-100,000 buffered w/ 8.4% NaHCO3 Reason for type of repair: reduce tension to allow closure, avoid adjacent structures, allow side-to-side closure without requiring a flap or graft and compensate for the inelasticity of skin in this area   Undermining: area extensively undermined   Subcutaneous layers (deep stitches):  Suture size:  5-0 Suture type: Monocryl (poliglecaprone 25)   Stitches:  Buried vertical mattress Fine/surface layer approximation (top stitches):  Suture size:  6-0 Suture type: fast-absorbing plain gut   Stitches: simple running   Hemostasis achieved with: suture, pressure and electrodesiccation Outcome: patient tolerated procedure well with no complications   Post-procedure details: sterile dressing applied and wound care instructions given   Dressing type: bandage and pressure dressing    Return in about 4 weeks (around 06/10/2023).   05/13/2023  HISTORY OF PRESENT ILLNESS  Katherine Cohen is seen in consultation at the request of Dr. 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 They note that the area has been present for about 6 months increasing in size with time.  There is no history of previous treatment.  Reports no other new or changing lesions and has no other complaints today.  Medications and allergies: see patient chart.  Review of systems: Reviewed 8 systems and notable for the above skin cancer.  All other systems reviewed are unremarkable/negative, unless noted in the HPI. Past medical history, surgical history, family history, social history were also reviewed and are noted in the chart/questionnaire.    PHYSICAL EXAMINATION  General: Well-appearing, in no acute distress, alert and oriented x 4. Vitals reviewed in chart (if available).   Skin: Exam reveals a 1.1 x 0.6  cm erythematous papule and biopsy scar  on the left cheek. There are rhytids, telangiectasias, and lentigines, consistent with photodamage.  Biopsy report(s) reviewed, confirming the diagnosis.   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 Due to location, size, histology, or recurrence and the likelihood of subclinical extension as well as the need to conserve normal surrounding tissue, the patient was deemed acceptable for Mohs micrographic surgery (MMS).  The nature and purpose of the procedure, associated benefits and risks including recurrence and scarring, possible complications such as pain, infection, and bleeding, and alternative methods of treatment if appropriate were discussed with the patient during consent. The lesion location was verified by the patient, by reviewing previous notes, pathology reports, and by photographs as well as angulation measurements if available.  Informed consent was reviewed and signed by the patient, and timeout was performed at 10:45 AM. See op note below.  2. For the photodamage and solar lentigines, sun protection discussed/information given on OTC sunscreens, and we recommend continued regular follow-up with primary dermatologist every 6 months or sooner for any growing, bleeding, or changing lesions. 3. Prognosis and future surveillance discussed. 4. Letter with treatment outcome sent to referring provider. 5. Pain acetaminophen   MOHS MICROGRAPHIC SURGERY AND RECONSTRUCTION  Initial size:   1.1 x 0.6 cm Surgical defect/wound size: 1.8 x 0.4 cm Anesthesia:    0.33% lidocaine with 1:200,000 epinephrine EBL:    <5 mL Complications:  None Repair type:   Complex SQ suture:   5-0 Monocryl Cutaneous suture:  6-0 Plain gut Final size of the repair: 4.2 cm  Stages: 1  STAGE I: Anesthesia achieved with 0.5% lidocaine with 1:200,000 epinephrine. ChloraPrep applied. 1 section(s) excised using Mohs technique  (this includes total peripheral and deep tissue margin excision and evaluation with frozen sections, excised and interpreted by the same physician). The tumor was first debulked and then excised with an approx. 2mm margin.  Hemostasis was achieved with electrocautery as needed.  The specimen was then oriented, subdivided/relaxed, inked, and processed using Mohs technique.    Frozen section analysis revealed a clear deep and peripheral margin.  Reconstruction  The surgical wound was then cleaned, prepped, and re-anesthetized as above. Wound edges were undermined extensively along at least one entire edge and at a distance equal to or greater than the width of the defect (see wound defect size above) in order to achieve closure and decrease wound tension and anatomic distortion. Redundant tissue repair including standing cone removal was performed. Hemostasis was achieved with electrocautery. Subcutaneous and epidermal tissues were approximated with the above sutures. The surgical site was then lightly scrubbed with sterile, saline-soaked gauze. The area was then bandaged using Vaseline ointment, non-adherent gauze, gauze pads, and tape to provide an adequate pressure dressing. The patient tolerated the procedure well, was given detailed written and verbal wound care instructions, and was discharged in good condition.   The patient will follow-up: 4 weeks.    Documentation: I have reviewed the above documentation for accuracy and completeness, and I agree with the above.  Gwenith Daily, MD

## 2023-05-20 ENCOUNTER — Encounter: Payer: Self-pay | Admitting: Dermatology

## 2023-05-26 ENCOUNTER — Other Ambulatory Visit: Payer: Self-pay | Admitting: Family Medicine

## 2023-05-31 ENCOUNTER — Encounter: Admitting: Dermatology

## 2023-06-09 ENCOUNTER — Encounter: Payer: Self-pay | Admitting: Dermatology

## 2023-06-14 ENCOUNTER — Encounter: Payer: Self-pay | Admitting: Dermatology

## 2023-06-14 ENCOUNTER — Ambulatory Visit (INDEPENDENT_AMBULATORY_CARE_PROVIDER_SITE_OTHER): Admitting: Dermatology

## 2023-06-14 DIAGNOSIS — D0461 Carcinoma in situ of skin of right upper limb, including shoulder: Secondary | ICD-10-CM

## 2023-06-14 DIAGNOSIS — D099 Carcinoma in situ, unspecified: Secondary | ICD-10-CM

## 2023-06-14 DIAGNOSIS — L905 Scar conditions and fibrosis of skin: Secondary | ICD-10-CM | POA: Diagnosis not present

## 2023-06-14 DIAGNOSIS — Z85828 Personal history of other malignant neoplasm of skin: Secondary | ICD-10-CM

## 2023-06-14 DIAGNOSIS — C4492 Squamous cell carcinoma of skin, unspecified: Secondary | ICD-10-CM

## 2023-06-14 NOTE — Progress Notes (Signed)
   Follow-Up Visit   Subjective  Katherine Cohen is a 83 y.o. female who presents for the following: [facility] Pt here for [facility] on right elbow  Also s/p Mohs on the left cheek for a Well Differentiated SCC, treated on 03/202/205, repaired with linear closure.   The following portions of the chart were reviewed this encounter and updated as appropriate: medications, allergies, medical history  Review of Systems:  No other skin or systemic complaints except as noted in HPI or Assessment and Plan.  Objective  Well appearing patient in no apparent distress; mood and affect are within normal limits.  A focused examination was performed of the following areas: Right elbow-posterior  Relevant exam findings are noted in the Assessment and Plan.     Right Elbow - Posterior Healing biopsy site  Assessment & Plan    Scar s/p Mohs for for [facility] on left cheek, treated on 05/13/23, repaired with linear closure - Reassured that wound has healed well - Discussed that scars take up to 12 months to mature from the date of surgery - Recommend SPF 30+ to scar daily to prevent purple color - OK to start scar massage at 4-6 weeks post-op - Can consider silicone based products for scar healing  HISTORY OF SQUAMOUS CELL CARCINOMA OF THE SKIN - No evidence of recurrence today - No lymphadenopathy - Recommend regular full body skin exams - Recommend daily broad spectrum sunscreen SPF 30+ to sun-exposed areas, reapply every 2 hours as needed.  - Call if any new or changing lesions are noted between office visits  SQUAMOUS CELL CARCINOMA OF SKIN Right Elbow - Posterior Destruction of lesion Complexity: extensive   Destruction method: electrodesiccation and curettage   Informed consent: discussed and consent obtained   Timeout:  patient name, date of birth, surgical site, and procedure verified Procedure prep:  Patient was prepped and draped in usual sterile fashion Prep type:  Isopropyl  alcohol Anesthesia: the lesion was anesthetized in a standard fashion   Anesthetic:  1% lidocaine  w/ epinephrine  1-100,000 buffered w/ 8.4% NaHCO3 Curettage performed in three different directions: Yes   Electrodesiccation performed over the curetted area: Yes   Hemostasis achieved with:  pressure and electrodesiccation Outcome: patient tolerated procedure well with no complications   Post-procedure details: sterile dressing applied and wound care instructions given   Dressing type: petrolatum, bandage and pressure dressing   Additional details:  1.1 x 0.8 SCAR   SQUAMOUS CELL CARCINOMA IN SITU    Return in about 6 months (around 12/14/2023) for TBSE.  I, Wilson Hasten, CMA, am acting as scribe for Deneise Finlay, MD.   Documentation: I have reviewed the above documentation for accuracy and completeness, and I agree with the above.  Deneise Finlay, MD

## 2023-06-14 NOTE — Patient Instructions (Signed)
 Post-Operative Scar Care: Education and Recommendations  Following your procedure, it's important to care for your scar to promote optimal healing and minimize its appearance. Proper post-operative care can help ensure that the scar heals well, and with time, it may become less noticeable. Below are key recommendations for scar care, including scar massage and the use of silicone scar gels or sheets.  1. General Scar Care Tips: -  Keep the wound clean and dry: Follow your healthcare provider's instructions for wound care, including cleaning the site and changing dressings as needed. -  Avoid sun exposure: Direct sunlight can darken scars and make them more noticeable. Once your wound has healed, apply sunscreen (SPF 30 or higher) to protect the scar from UV rays.  2. Scar Massage: - Start after healing: Wait until the scar has fully healed, with no scabs or open areas (usually 4-6 weeks after surgery). Your healthcare provider will give you specific guidance on when to begin. - Technique: Gently massage the scar in a circular motion for 5-10 minutes, 2-3 times per day. This helps to soften the tissue, reduce swelling, and improve the overall appearance of the scar. - Pressure: Apply gentle, firm pressure during the massage to break down the dense tissue that may form during healing. This helps to prevent the formation of keloids or hypertrophic scars. - Use lotion or ointment: Consider using a mild, fragrance-free lotion or vitamin E ointment to help lubricate the area during massage.  3. Silicone Scar Gels or Sheets: - When to start: Once your wound has healed completely, typically around 4-6 weeks, you can begin using silicone-based scar gels or sheets. These have been shown to improve scar appearance by hydrating the tissue and reducing inflammation. - How to use silicone gels: Apply a thin layer of the gel to the scar and allow it to dry before covering with clothing. You can use the  gel multiple times a day, depending on your provider's recommendation. - How to use silicone sheets: Cut the sheet to fit the size of your scar, and apply it directly to the healed scar. Wear it for 12-24 hours a day, and replace the sheet every few days as directed. - Benefits: Silicone helps reduce redness, flatten the scar, and improve its texture. Continued use over several months can lead to significant improvement in the appearance of the scar.  4. What to Expect: - Healing process: Scars generally take time to mature. The first few months may show redness or swelling, but this usually improves as healing progresses. - Long-term care: Scarring is a natural part of the healing process. While you cannot completely eliminate a scar, proper care can significantly improve its appearance over time. - Patience: It can take up to a year for a scar to fully mature, so it's important to be consistent with scar care and follow-up appointments with your provider.  5. When to Contact Your Healthcare Provider: - If you notice signs of infection (increased redness, warmth, drainage, or pain). - If your scar becomes unusually raised, itchy, or changes in color significantly. - If you have concerns about the appearance of your scar or experience unusual symptoms. - By following these guidelines, you can support your body's natural healing process and help ensure the best possible outcome for your scar. If you have any questions or concerns, please don't hesitate to contact our office.   Patient Handout: Wound Care for Skin Biopsy Site  Taking Care of Your Skin  Biopsy Site  Proper care of the biopsy site is essential for promoting healing and minimizing scarring. This handout provides instructions on how to care for your biopsy site to ensure optimal recovery.  1. Cleaning the Wound:  Clean the biopsy site daily with gentle soap and water. Gently pat the area dry with a clean, soft towel. Avoid harsh  scrubbing or rubbing the area, as this can irritate the skin and delay healing.  2. Applying Aquaphor and Bandage:  After cleaning the wound, apply a thin layer of Aquaphor ointment to the biopsy site. Cover the area with a sterile bandage to protect it from dirt, bacteria, and friction. Change the bandage daily or as needed if it becomes soiled or wet.  3. Continued Care for One Week:  Repeat the cleaning, Aquaphor application, and bandaging process daily for one week following the biopsy procedure. Keeping the wound clean and moist during this initial healing period will help prevent infection and promote optimal healing.  4. Massaging Aquaphor into the Area:  ---After one week, discontinue the use of bandages but continue to apply Aquaphor to the biopsy site. ----Gently massage the Aquaphor into the area using circular motions. ---Massaging the skin helps to promote circulation and prevent the formation of scar tissue.   Additional Tips:  Avoid exposing the biopsy site to direct sunlight during the healing process, as this can cause hyperpigmentation or worsen scarring. If you experience any signs of infection, such as increased redness, swelling, warmth, or drainage from the wound, contact your healthcare provider immediately. Follow any additional instructions provided by your healthcare provider for caring for the biopsy site and managing any discomfort. Conclusion:  Taking proper care of your skin biopsy site is crucial for ensuring optimal healing and minimizing scarring. By following these instructions for cleaning, applying Aquaphor, and massaging the area, you can promote a smooth and successful recovery. If you have any questions or concerns about caring for your biopsy site, don't hesitate to contact your healthcare provider for guidance.   Important Information  Due to recent changes in healthcare laws, you may see results of your pathology and/or laboratory studies on  MyChart before the doctors have had a chance to review them. We understand that in some cases there may be results that are confusing or concerning to you. Please understand that not all results are received at the same time and often the doctors may need to interpret multiple results in order to provide you with the best plan of care or course of treatment. Therefore, we ask that you please give us  2 business days to thoroughly review all your results before contacting the office for clarification. Should we see a critical lab result, you will be contacted sooner.   If You Need Anything After Your Visit  If you have any questions or concerns for your doctor, please call our main line at [phone] If no one answers, please leave a voicemail as directed and we will return your call as soon as possible. Messages left after 4 pm will be answered the following business day.   You may also send us  a message via MyChart. We typically respond to MyChart messages within 1-2 business days.  For prescription refills, please ask your pharmacy to contact our office. Our fax number is [phone].  If you have an urgent issue when the clinic is closed that cannot wait until the next business day, you can page your doctor at the number below.    Please  note that while we do our best to be available for urgent issues outside of office hours, we are not available 24/7.   If you have an urgent issue and are unable to reach us , you may choose to seek medical care at your doctor's office, retail clinic, urgent care center, or emergency room.  If you have a medical emergency, please immediately call 911 or go to the emergency department. In the event of inclement weather, please call our main line at [phone] for an update on the status of any delays or closures.  Dermatology Medication Tips: Please keep the boxes that topical medications come in in order to help keep track of the instructions about where  and how to use these. Pharmacies typically print the medication instructions only on the boxes and not directly on the medication tubes.   If your medication is too expensive, please contact our office at [phone] or send us  a message through MyChart.   We are unable to tell what your co-pay for medications will be in advance as this is different depending on your insurance coverage. However, we may be able to find a substitute medication at lower cost or fill out paperwork to get insurance to cover a needed medication.   If a prior authorization is required to get your medication covered by your insurance company, please allow us  1-2 business days to complete this process.  Drug prices often vary depending on where the prescription is filled and some pharmacies may offer cheaper prices.  The website www.goodrx.com contains coupons for medications through different pharmacies. The prices here do not account for what the cost may be with help from insurance (it may be cheaper with your insurance), but the website can give you the price if you did not use any insurance.  - You can print the associated coupon and take it with your prescription to the pharmacy.  - You may also stop by our office during regular business hours and pick up a GoodRx coupon card.  - If you need your prescription sent electronically to a different pharmacy, notify our office through [facility] or by phone at [phone]    Skin Education :   I counseled the patient regarding the following: Sun screen (SPF 30 or greater) should be applied during peak UV exposure (between 10am and 2pm) and reapplied after exercise or swimming.  The ABCDEs of melanoma were reviewed with the patient, and the importance of monthly self-examination of moles was emphasized. Should any moles change in shape or color, or itch, bleed or burn, pt will contact our office for evaluation sooner then their interval appointment.  Plan:  Sunscreen Recommendations I recommended a broad spectrum sunscreen with a SPF of 30 or higher. I explained that SPF 30 sunscreens block approximately 97 percent of the sun's harmful rays. Sunscreens should be applied at least 15 minutes prior to expected sun exposure and then every 2 hours after that as long as sun exposure continues. If swimming or exercising sunscreen should be reapplied every 45 minutes to an hour after getting wet or sweating. One ounce, or the equivalent of a shot glass full of sunscreen, is adequate to protect the skin not covered by a bathing suit. I also recommended a lip balm with a sunscreen as well. Sun protective clothing can be used in lieu of sunscreen but must be worn the entire time you are exposed to the sun's rays.

## 2023-06-16 ENCOUNTER — Other Ambulatory Visit: Payer: Self-pay | Admitting: Family Medicine

## 2023-06-17 MED ORDER — METOPROLOL TARTRATE 50 MG PO TABS: 50.0000 mg | ORAL_TABLET | Freq: Two times a day (BID) | ORAL | 1 refills | Status: DC

## 2023-07-28 ENCOUNTER — Encounter: Payer: Self-pay | Admitting: Dermatology

## 2023-09-09 ENCOUNTER — Other Ambulatory Visit: Payer: Self-pay | Admitting: Family Medicine

## 2023-09-22 ENCOUNTER — Other Ambulatory Visit: Payer: Self-pay | Admitting: Family Medicine

## 2023-10-12 NOTE — Progress Notes (Deleted)
 Subjective:   Katherine Cohen is a 83 y.o. who presents for a Medicare Wellness preventive visit.  As a reminder, Annual Wellness Visits don't include a physical exam, and some assessments may be limited, especially if this visit is performed virtually. We may recommend an in-person follow-up visit with your provider if needed.  Visit Complete: {VISITMETHODVS:[id number]}  {AWVVIDEO:32072}  Persons Participating in Visit: {Persons Participating in Visit:32444}  AWV Questionnaire: {AWVQuestionnaire:32338}        Objective:    There were no vitals filed for this visit. There is no height or weight on file to calculate BMI.     10/06/2022    1:07 PM 10/26/2020   10:10 AM 07/19/2013   10:05 AM 05/22/2011    4:14 PM 05/14/2011    2:49 PM  Advanced Directives  Does Patient Have a Medical Advance Directive? No No Patient does not have advance directive;Patient would like information  Patient does not have advance directive;Patient would not like information  Patient does not have advance directive   Would patient like information on creating a medical advance directive? No - Patient declined  Advance directive brochure given (Outpatient ONLY)     Pre-existing out of facility DNR order (yellow form or pink MOST form)    No  No      Data saved with a previous flowsheet row definition    Current Medications (verified) Outpatient Encounter Medications as of 10/12/2023  Medication Sig   AMBULATORY NON FORMULARY MEDICATION Medication Name: *Cane.  DX: Lower extremity weakness   benzonatate  (TESSALON ) 100 MG capsule Take 1 capsule (100 mg total) by mouth 2 (two) times daily.   celecoxib  (CELEBREX ) 100 MG capsule TAKE 1 CAPSULE BY MOUTH DAILY AS NEEDED FOR MODERATE PAIN (PAIN  SCORE 4-6).   chlorthalidone  (HYGROTON ) 25 MG tablet TAKE 1 TABLET BY MOUTH DAILY   fexofenadine (ALLEGRA) 180 MG tablet Take 180 mg by mouth daily.   lisinopril  (ZESTRIL ) 40 MG tablet TAKE 1 TABLET BY MOUTH DAILY    metoprolol  tartrate (LOPRESSOR ) 50 MG tablet Take 1 tablet (50 mg total) by mouth 2 (two) times daily.   Multiple Vitamins-Minerals (PRESERVISION AREDS 2 PO) Take 1 Capful by mouth daily.   omeprazole  (PRILOSEC ) 40 MG capsule TAKE 1 CAPSULE BY MOUTH DAILY   pravastatin  (PRAVACHOL ) 20 MG tablet TAKE 1 TABLET BY MOUTH AT  BEDTIME   vitamin B-12 (CYANOCOBALAMIN ) 500 MCG tablet Take 500 mcg by mouth daily.   No facility-administered encounter medications on file as of 10/12/2023.    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 Tsuei, MD;  Location: [facility];  Service: General;  Laterality: N/A;   HERNIA REPAIR     left  inguinal hernia repair    NECK DISSECTION     L   OTHER SURGICAL HISTORY     right ankle ulcerated wound I and D    VENTRAL HERNIA REPAIR  05/22/2011   Procedure: HERNIA REPAIR VENTRAL ADULT;  Surgeon: Donnice POUR. Belinda, MD;  Location: [facility];  Service: General;  Laterality: N/A;   Family History  Problem Relation Age of Onset   Stroke Mother    Heart disease Father        AMI   Lung cancer Brother        lung   Social History   Socioeconomic History   Marital status: Widowed    Spouse name: Not on file   Number of children: Not on file   Years of education: Not on file   Highest education level: Not on file  Occupational History   Not on file  Tobacco Use   Smoking status: Never   Smokeless tobacco: Never  Substance and Sexual Activity   Alcohol use: Yes    Alcohol/week: 7.0 standard drinks of alcohol    Types: 7 Glasses of wine  per week    Comment: red wine    Drug use: No    Types: Hydromorphone    Sexual activity: Not on file  Other Topics Concern   Not on file  Social History Narrative   Lives with son   Social Drivers of Health   Financial Resource Strain: Low Risk  (10/06/2022)   Overall Financial Resource Strain (CARDIA)    Difficulty of Paying Living Expenses: Not hard at all  Food Insecurity: No Food Insecurity (10/06/2022)   Hunger Vital Sign    Worried About Running Out of Food in the Last Year: Never true    Ran Out of Food in the Last Year: Never true  Transportation Needs: No Transportation Needs (10/06/2022)   PRAPARE - Administrator, Civil Service (Medical): No    Lack of Transportation (Non-Medical): No  Physical Activity: Inactive (10/06/2022)   Exercise Vital Sign    Days of Exercise per Week: 0 days    Minutes of Exercise per Session: 0 min  Stress: No Stress Concern Present (10/06/2022)   [employer] of Occupational Health - Occupational Stress Questionnaire    Feeling of Stress : Not at all  Social Connections: Moderately Isolated (10/06/2022)   Social Connection and Isolation Panel    Frequency of Communication with Friends and Family: More than three times a week    Frequency of Social Gatherings with Friends and Family: More than three times a week    Attends Religious Services: More than 4 times per year    Active Member of [employer] or Organizations: No    Attends Banker Meetings: Never    Marital Status: Widowed    Tobacco Counseling Counseling given: Not Answered    Clinical Intake:              No results found for: HGBA1C             Activities of Daily Living ***     No data to display          Patient Care Team: Tower, Laine LABOR, MD as PCP - General (Family Medicine) *** I have updated your Care Teams any recent Medical Services you may have received from other providers in the past year.     Assessment:    This is a routine wellness examination for Katherine Cohen.  Hearing/Vision screen No results found.   Goals Addressed   None    Depression Screen ***    10/06/2022    1:03 PM 07/14/2022   10:21 AM 10/26/2020   10:09 AM 06/24/2020   11:47 AM 03/12/2020    1:18 PM 09/21/2019   10:14 AM  01/03/2019    3:07 PM  PHQ 2/9 Scores  PHQ - 2 Score 0 1 0 0 3 0 0  PHQ- 9 Score  3    2     Fall Risk ***    10/06/2022    1:09 PM 07/14/2022   10:21 AM 10/26/2020   10:11 AM 06/24/2020   11:46 AM 08/14/2019   10:47 AM  Fall Risk   Falls in the past year? 0 1 1 0 0  Number falls in past yr: 0 1 1 0   Injury with Fall? 0 0 1 0   Risk for fall due to : No Fall Risks History of fall(s) Impaired balance/gait;Impaired mobility;Impaired vision No Fall Risks No Fall Risks  Follow up Falls prevention discussed;Falls evaluation completed Falls evaluation completed Falls prevention discussed  Falls evaluation completed       Data saved with a previous flowsheet row definition    MEDICARE RISK AT HOME: ***    TIMED UP AND GO:  Was the test performed?  {AMBTIMEDUPGO:[phone]}  Cognitive Function: {CognitiveScreening:32337}        10/06/2022    1:13 PM 10/26/2020   10:14 AM  6CIT Screen  What Year? 0 points 0 points  What month? 0 points 0 points  What time? 0 points 0 points  Count back from 20 0 points 0 points  Months in reverse 0 points 4 points  Repeat phrase 2 points 2 points  Total Score 2 points 6 points    Immunizations Immunization History  Administered Date(s) Administered   Janssen (J&J) SARS-COV-2 Vaccination 06/26/2019   Moderna Sars-Covid-2 Vaccination 01/08/2020   Pneumococcal Conjugate-13 03/22/2014   Pneumococcal Polysaccharide-23 08/04/2007   Td 08/04/2007    Screening Tests Health Maintenance  Topic Date Due   Zoster Vaccines- Shingrix (1 of 2) Never done   Medicare Annual Wellness (AWV)  10/06/2023   INFLUENZA VACCINE  09/24/2023   Colonoscopy  01/03/2024 (Originally  04/29/2016)   DTaP/Tdap/Td (2 - Tdap) 03/08/2024 (Originally 08/03/2017)   COVID-19 Vaccine (3 - 2024-25 season) 03/24/2025 (Originally 10/25/2022)   Pneumococcal Vaccine: 50+ Years  Completed   DEXA SCAN  Completed   HPV VACCINES  Aged Out   Meningococcal B Vaccine  Aged Out    Health Maintenance  Health Maintenance Due  Topic Date Due   Zoster Vaccines- Shingrix (1 of 2) Never done   Medicare Annual Wellness (AWV)  10/06/2023   INFLUENZA VACCINE  09/24/2023   Health Maintenance Items Addressed: {HMMCR (Optional):30011}  Additional Screening:  Vision Screening: Recommended annual ophthalmology exams for early detection of glaucoma and other disorders of the eye. Would you like a referral to an eye doctor? {YES/NO:21197}   Dental Screening: Recommended annual dental exams for proper oral hygiene  Community Resource Referral / Chronic Care Management: CRR required this visit?  {YES/NO:21197}  CCM required this visit?  {CCM Required choices:[phone]}   Plan:    I have personally reviewed and noted the following in the patient's chart:   Medical and social history Use of alcohol, tobacco or illicit drugs  Current medications and supplements including opioid prescriptions. {Opioid Prescriptions:[phone]} Functional ability and status Nutritional status Physical activity Advanced directives List of other physicians Hospitalizations, surgeries, and ER visits in previous 12 months Vitals Screenings to include cognitive, depression, and falls Referrals and appointments  In addition, I have reviewed and discussed with patient certain preventive protocols, quality metrics, and best practice recommendations. A written personalized care plan for preventive services as  well as general preventive health recommendations were provided to patient.   Katherine LITTIE Saris, LPN   1/80/7974   After Visit Summary: {CHL AMB AWV After Visit [phone]}  Notes: {Nurse  Notes:32343}

## 2023-12-10 ENCOUNTER — Other Ambulatory Visit: Payer: Self-pay | Admitting: Family Medicine

## 2023-12-14 ENCOUNTER — Ambulatory Visit: Admitting: Dermatology

## 2024-02-27 ENCOUNTER — Other Ambulatory Visit: Payer: Self-pay | Admitting: Family Medicine
